# Patient Record
Sex: Female | Born: 1950 | Race: White | Hispanic: No | Smoking: Former smoker
Health system: Southern US, Community
[De-identification: ages and names within clinical notes are randomized; demographics above are authoritative.]

## PROBLEM LIST (undated history)

## (undated) DIAGNOSIS — H3320 Serous retinal detachment, unspecified eye: Secondary | ICD-10-CM

## (undated) DIAGNOSIS — M8580 Other specified disorders of bone density and structure, unspecified site: Secondary | ICD-10-CM

## (undated) DIAGNOSIS — M1990 Unspecified osteoarthritis, unspecified site: Secondary | ICD-10-CM

## (undated) DIAGNOSIS — H269 Unspecified cataract: Secondary | ICD-10-CM

## (undated) DIAGNOSIS — C801 Malignant (primary) neoplasm, unspecified: Secondary | ICD-10-CM

## (undated) HISTORY — PX: RETINAL DETACHMENT SURGERY: SHX105

## (undated) HISTORY — PX: OOPHORECTOMY: SHX86

## (undated) HISTORY — DX: Unspecified osteoarthritis, unspecified site: M19.90

## (undated) HISTORY — DX: Unspecified cataract: H26.9

## (undated) HISTORY — DX: Other specified disorders of bone density and structure, unspecified site: M85.80

## (undated) HISTORY — PX: EYE SURGERY: SHX253

## (undated) HISTORY — DX: Malignant (primary) neoplasm, unspecified: C80.1

## (undated) HISTORY — PX: COLONOSCOPY: SHX174

## (undated) HISTORY — DX: Serous retinal detachment, unspecified eye: H33.20

---
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
## 1998-02-27 ENCOUNTER — Other Ambulatory Visit: Admission: RE | Admit: 1998-02-27 | Discharge: 1998-02-27 | Payer: Self-pay | Admitting: Obstetrics and Gynecology

## 1998-07-25 ENCOUNTER — Other Ambulatory Visit: Admission: RE | Admit: 1998-07-25 | Discharge: 1998-07-25 | Payer: Self-pay | Admitting: Gynecology

## 1999-02-04 ENCOUNTER — Other Ambulatory Visit: Admission: RE | Admit: 1999-02-04 | Discharge: 1999-02-04 | Payer: Self-pay | Admitting: Gynecology

## 1999-02-04 ENCOUNTER — Encounter (INDEPENDENT_AMBULATORY_CARE_PROVIDER_SITE_OTHER): Payer: Self-pay | Admitting: Specialist

## 1999-07-09 ENCOUNTER — Encounter: Admission: RE | Admit: 1999-07-09 | Discharge: 1999-07-09 | Payer: Self-pay | Admitting: Gynecology

## 1999-07-09 ENCOUNTER — Encounter: Payer: Self-pay | Admitting: Gynecology

## 1999-07-12 ENCOUNTER — Encounter: Payer: Self-pay | Admitting: Gynecology

## 1999-07-12 ENCOUNTER — Encounter: Admission: RE | Admit: 1999-07-12 | Discharge: 1999-07-12 | Payer: Self-pay | Admitting: Gynecology

## 1999-08-14 HISTORY — PX: BREAST BIOPSY: SHX20

## 1999-08-26 ENCOUNTER — Encounter: Payer: Self-pay | Admitting: Surgery

## 1999-08-26 ENCOUNTER — Ambulatory Visit (HOSPITAL_BASED_OUTPATIENT_CLINIC_OR_DEPARTMENT_OTHER): Admission: RE | Admit: 1999-08-26 | Discharge: 1999-08-26 | Payer: Self-pay | Admitting: Surgery

## 1999-09-12 ENCOUNTER — Other Ambulatory Visit: Admission: RE | Admit: 1999-09-12 | Discharge: 1999-09-12 | Payer: Self-pay | Admitting: Gynecology

## 2000-01-03 ENCOUNTER — Encounter: Payer: Self-pay | Admitting: Surgery

## 2000-01-03 ENCOUNTER — Encounter: Admission: RE | Admit: 2000-01-03 | Discharge: 2000-01-03 | Payer: Self-pay | Admitting: Surgery

## 2000-07-14 ENCOUNTER — Encounter: Payer: Self-pay | Admitting: Surgery

## 2000-07-14 ENCOUNTER — Encounter: Admission: RE | Admit: 2000-07-14 | Discharge: 2000-07-14 | Payer: Self-pay | Admitting: Surgery

## 2000-07-17 ENCOUNTER — Encounter: Admission: RE | Admit: 2000-07-17 | Discharge: 2000-07-17 | Payer: Self-pay | Admitting: Surgery

## 2000-07-17 ENCOUNTER — Encounter: Payer: Self-pay | Admitting: Surgery

## 2000-09-23 ENCOUNTER — Encounter: Admission: RE | Admit: 2000-09-23 | Discharge: 2000-09-23 | Payer: Self-pay | Admitting: Surgery

## 2000-09-23 ENCOUNTER — Encounter (INDEPENDENT_AMBULATORY_CARE_PROVIDER_SITE_OTHER): Payer: Self-pay | Admitting: *Deleted

## 2000-09-23 ENCOUNTER — Encounter: Payer: Self-pay | Admitting: Surgery

## 2000-09-23 ENCOUNTER — Ambulatory Visit (HOSPITAL_BASED_OUTPATIENT_CLINIC_OR_DEPARTMENT_OTHER): Admission: RE | Admit: 2000-09-23 | Discharge: 2000-09-23 | Payer: Self-pay | Admitting: Orthopedic Surgery

## 2000-10-20 ENCOUNTER — Other Ambulatory Visit: Admission: RE | Admit: 2000-10-20 | Discharge: 2000-10-20 | Payer: Self-pay | Admitting: Gynecology

## 2001-01-13 HISTORY — PX: ABDOMINAL HYSTERECTOMY: SHX81

## 2001-02-17 ENCOUNTER — Encounter: Admission: RE | Admit: 2001-02-17 | Discharge: 2001-02-17 | Payer: Self-pay | Admitting: Surgery

## 2001-02-17 ENCOUNTER — Encounter: Payer: Self-pay | Admitting: Surgery

## 2001-04-15 ENCOUNTER — Other Ambulatory Visit: Admission: RE | Admit: 2001-04-15 | Discharge: 2001-04-15 | Payer: Self-pay | Admitting: Gynecology

## 2001-07-06 ENCOUNTER — Encounter: Payer: Self-pay | Admitting: Surgery

## 2001-07-06 ENCOUNTER — Encounter: Admission: RE | Admit: 2001-07-06 | Discharge: 2001-07-06 | Payer: Self-pay | Admitting: Surgery

## 2001-07-14 ENCOUNTER — Inpatient Hospital Stay (HOSPITAL_COMMUNITY): Admission: RE | Admit: 2001-07-14 | Discharge: 2001-07-16 | Payer: Self-pay | Admitting: Gynecology

## 2001-07-14 ENCOUNTER — Encounter (INDEPENDENT_AMBULATORY_CARE_PROVIDER_SITE_OTHER): Payer: Self-pay | Admitting: Specialist

## 2001-12-01 ENCOUNTER — Other Ambulatory Visit: Admission: RE | Admit: 2001-12-01 | Discharge: 2001-12-01 | Payer: Self-pay | Admitting: Gynecology

## 2002-07-14 ENCOUNTER — Encounter: Payer: Self-pay | Admitting: Surgery

## 2002-07-14 ENCOUNTER — Encounter: Admission: RE | Admit: 2002-07-14 | Discharge: 2002-07-14 | Payer: Self-pay | Admitting: Surgery

## 2002-12-15 ENCOUNTER — Other Ambulatory Visit: Admission: RE | Admit: 2002-12-15 | Discharge: 2002-12-15 | Payer: Self-pay | Admitting: Gynecology

## 2003-08-02 ENCOUNTER — Encounter: Admission: RE | Admit: 2003-08-02 | Discharge: 2003-08-02 | Payer: Self-pay | Admitting: Gynecology

## 2004-01-14 DIAGNOSIS — C50919 Malignant neoplasm of unspecified site of unspecified female breast: Secondary | ICD-10-CM

## 2004-01-14 DIAGNOSIS — C801 Malignant (primary) neoplasm, unspecified: Secondary | ICD-10-CM

## 2004-01-14 HISTORY — DX: Malignant neoplasm of unspecified site of unspecified female breast: C50.919

## 2004-01-14 HISTORY — PX: BREAST LUMPECTOMY: SHX2

## 2004-01-14 HISTORY — DX: Malignant (primary) neoplasm, unspecified: C80.1

## 2004-08-30 ENCOUNTER — Encounter: Admission: RE | Admit: 2004-08-30 | Discharge: 2004-08-30 | Payer: Self-pay | Admitting: Surgery

## 2004-09-11 ENCOUNTER — Encounter (INDEPENDENT_AMBULATORY_CARE_PROVIDER_SITE_OTHER): Payer: Self-pay | Admitting: *Deleted

## 2004-09-11 ENCOUNTER — Encounter (INDEPENDENT_AMBULATORY_CARE_PROVIDER_SITE_OTHER): Payer: Self-pay | Admitting: Diagnostic Radiology

## 2004-09-11 ENCOUNTER — Encounter: Admission: RE | Admit: 2004-09-11 | Discharge: 2004-09-11 | Payer: Self-pay | Admitting: Surgery

## 2004-09-19 ENCOUNTER — Encounter: Admission: RE | Admit: 2004-09-19 | Discharge: 2004-09-19 | Payer: Self-pay | Admitting: Surgery

## 2004-09-27 ENCOUNTER — Encounter: Admission: RE | Admit: 2004-09-27 | Discharge: 2004-09-27 | Payer: Self-pay | Admitting: Surgery

## 2004-09-27 ENCOUNTER — Ambulatory Visit (HOSPITAL_COMMUNITY): Admission: RE | Admit: 2004-09-27 | Discharge: 2004-09-27 | Payer: Self-pay | Admitting: Surgery

## 2004-09-27 ENCOUNTER — Ambulatory Visit (HOSPITAL_BASED_OUTPATIENT_CLINIC_OR_DEPARTMENT_OTHER): Admission: RE | Admit: 2004-09-27 | Discharge: 2004-09-27 | Payer: Self-pay | Admitting: Surgery

## 2004-09-27 ENCOUNTER — Encounter (INDEPENDENT_AMBULATORY_CARE_PROVIDER_SITE_OTHER): Payer: Self-pay | Admitting: Specialist

## 2004-10-07 ENCOUNTER — Ambulatory Visit: Payer: Self-pay | Admitting: Oncology

## 2004-10-16 ENCOUNTER — Ambulatory Visit (HOSPITAL_COMMUNITY): Admission: RE | Admit: 2004-10-16 | Discharge: 2004-10-16 | Payer: Self-pay | Admitting: Oncology

## 2004-10-25 ENCOUNTER — Other Ambulatory Visit: Admission: RE | Admit: 2004-10-25 | Discharge: 2004-10-25 | Payer: Self-pay | Admitting: Gynecology

## 2004-11-19 ENCOUNTER — Ambulatory Visit (HOSPITAL_COMMUNITY): Admission: RE | Admit: 2004-11-19 | Discharge: 2004-11-19 | Payer: Self-pay | Admitting: Oncology

## 2004-11-22 ENCOUNTER — Ambulatory Visit: Admission: RE | Admit: 2004-11-22 | Discharge: 2005-02-07 | Payer: Self-pay | Admitting: Radiation Oncology

## 2005-01-30 ENCOUNTER — Ambulatory Visit: Payer: Self-pay | Admitting: Oncology

## 2005-04-15 ENCOUNTER — Ambulatory Visit: Payer: Self-pay | Admitting: Oncology

## 2005-09-01 ENCOUNTER — Encounter: Admission: RE | Admit: 2005-09-01 | Discharge: 2005-09-01 | Payer: Self-pay | Admitting: Surgery

## 2005-11-10 ENCOUNTER — Ambulatory Visit: Payer: Self-pay | Admitting: Oncology

## 2006-03-02 ENCOUNTER — Other Ambulatory Visit: Admission: RE | Admit: 2006-03-02 | Discharge: 2006-03-02 | Payer: Self-pay | Admitting: Gynecology

## 2006-05-11 ENCOUNTER — Ambulatory Visit: Payer: Self-pay | Admitting: Oncology

## 2006-05-14 LAB — CBC WITH DIFFERENTIAL/PLATELET
BASO%: 0.3 % (ref 0.0–2.0)
Basophils Absolute: 0 10*3/uL (ref 0.0–0.1)
EOS%: 1.1 % (ref 0.0–7.0)
HCT: 40.1 % (ref 34.8–46.6)
HGB: 14.3 g/dL (ref 11.6–15.9)
LYMPH%: 21.6 % (ref 14.0–48.0)
MCH: 31.9 pg (ref 26.0–34.0)
MCHC: 35.5 g/dL (ref 32.0–36.0)
NEUT%: 65.2 % (ref 39.6–76.8)
Platelets: 258 10*3/uL (ref 145–400)
lymph#: 1.3 10*3/uL (ref 0.9–3.3)

## 2006-05-14 LAB — COMPREHENSIVE METABOLIC PANEL
ALT: 8 U/L (ref 0–35)
AST: 19 U/L (ref 0–37)
BUN: 22 mg/dL (ref 6–23)
CO2: 26 mEq/L (ref 19–32)
Calcium: 9.5 mg/dL (ref 8.4–10.5)
Chloride: 103 mEq/L (ref 96–112)
Creatinine, Ser: 1.11 mg/dL (ref 0.40–1.20)
Total Bilirubin: 0.7 mg/dL (ref 0.3–1.2)

## 2006-05-14 LAB — CANCER ANTIGEN 27.29: CA 27.29: 13 U/mL (ref 0–39)

## 2006-09-07 ENCOUNTER — Encounter: Admission: RE | Admit: 2006-09-07 | Discharge: 2006-09-07 | Payer: Self-pay | Admitting: Oncology

## 2006-11-09 ENCOUNTER — Ambulatory Visit: Payer: Self-pay | Admitting: Oncology

## 2006-11-11 LAB — CBC WITH DIFFERENTIAL/PLATELET
Eosinophils Absolute: 0.1 10*3/uL (ref 0.0–0.5)
HCT: 40.2 % (ref 34.8–46.6)
LYMPH%: 21.1 % (ref 14.0–48.0)
MONO#: 0.6 10*3/uL (ref 0.1–0.9)
NEUT#: 4.7 10*3/uL (ref 1.5–6.5)
NEUT%: 68.9 % (ref 39.6–76.8)
Platelets: 268 10*3/uL (ref 145–400)
RBC: 4.44 10*6/uL (ref 3.70–5.32)
WBC: 6.8 10*3/uL (ref 3.9–10.0)
lymph#: 1.4 10*3/uL (ref 0.9–3.3)

## 2006-11-11 LAB — COMPREHENSIVE METABOLIC PANEL
ALT: 8 U/L (ref 0–35)
Albumin: 4.5 g/dL (ref 3.5–5.2)
CO2: 25 mEq/L (ref 19–32)
Calcium: 9.6 mg/dL (ref 8.4–10.5)
Chloride: 102 mEq/L (ref 96–112)
Glucose, Bld: 77 mg/dL (ref 70–99)
Sodium: 139 mEq/L (ref 135–145)
Total Bilirubin: 0.6 mg/dL (ref 0.3–1.2)
Total Protein: 7.3 g/dL (ref 6.0–8.3)

## 2006-11-11 LAB — CANCER ANTIGEN 27.29: CA 27.29: 12 U/mL (ref 0–39)

## 2007-03-05 ENCOUNTER — Other Ambulatory Visit: Admission: RE | Admit: 2007-03-05 | Discharge: 2007-03-05 | Payer: Self-pay | Admitting: Gynecology

## 2007-08-14 ENCOUNTER — Ambulatory Visit: Payer: Self-pay | Admitting: Oncology

## 2007-08-17 LAB — CBC WITH DIFFERENTIAL/PLATELET
BASO%: 0.4 % (ref 0.0–2.0)
EOS%: 1 % (ref 0.0–7.0)
HGB: 14.1 g/dL (ref 11.6–15.9)
MCH: 31.9 pg (ref 26.0–34.0)
MCHC: 34.8 g/dL (ref 32.0–36.0)
MCV: 91.6 fL (ref 81.0–101.0)
MONO%: 11.1 % (ref 0.0–13.0)
RBC: 4.41 10*6/uL (ref 3.70–5.32)
RDW: 13.1 % (ref 11.3–14.5)
lymph#: 1.2 10*3/uL (ref 0.9–3.3)

## 2007-08-18 LAB — COMPREHENSIVE METABOLIC PANEL
ALT: 10 U/L (ref 0–35)
AST: 19 U/L (ref 0–37)
Albumin: 4.3 g/dL (ref 3.5–5.2)
Alkaline Phosphatase: 121 U/L — ABNORMAL HIGH (ref 39–117)
Potassium: 4.2 mEq/L (ref 3.5–5.3)
Sodium: 140 mEq/L (ref 135–145)
Total Bilirubin: 0.5 mg/dL (ref 0.3–1.2)
Total Protein: 6.9 g/dL (ref 6.0–8.3)

## 2007-09-08 ENCOUNTER — Encounter: Admission: RE | Admit: 2007-09-08 | Discharge: 2007-09-08 | Payer: Self-pay | Admitting: Oncology

## 2007-10-15 ENCOUNTER — Ambulatory Visit: Payer: Self-pay | Admitting: Oncology

## 2007-10-19 LAB — COMPREHENSIVE METABOLIC PANEL
ALT: 10 U/L (ref 0–35)
AST: 22 U/L (ref 0–37)
Albumin: 4.2 g/dL (ref 3.5–5.2)
Alkaline Phosphatase: 82 U/L (ref 39–117)
BUN: 13 mg/dL (ref 6–23)
Calcium: 9 mg/dL (ref 8.4–10.5)
Chloride: 100 mEq/L (ref 96–112)
Potassium: 4 mEq/L (ref 3.5–5.3)
Sodium: 135 mEq/L (ref 135–145)
Total Protein: 6.7 g/dL (ref 6.0–8.3)

## 2007-12-08 ENCOUNTER — Ambulatory Visit (HOSPITAL_COMMUNITY): Admission: RE | Admit: 2007-12-08 | Discharge: 2007-12-08 | Payer: Self-pay | Admitting: Ophthalmology

## 2007-12-24 ENCOUNTER — Ambulatory Visit (HOSPITAL_COMMUNITY): Admission: RE | Admit: 2007-12-24 | Discharge: 2007-12-24 | Payer: Self-pay | Admitting: Ophthalmology

## 2008-04-24 ENCOUNTER — Encounter: Payer: Self-pay | Admitting: Gynecology

## 2008-04-24 ENCOUNTER — Ambulatory Visit: Payer: Self-pay | Admitting: Gynecology

## 2008-04-24 ENCOUNTER — Other Ambulatory Visit: Admission: RE | Admit: 2008-04-24 | Discharge: 2008-04-24 | Payer: Self-pay | Admitting: Gynecology

## 2008-05-22 ENCOUNTER — Ambulatory Visit (HOSPITAL_COMMUNITY): Admission: RE | Admit: 2008-05-22 | Discharge: 2008-05-22 | Payer: Self-pay | Admitting: Ophthalmology

## 2008-09-08 ENCOUNTER — Encounter: Admission: RE | Admit: 2008-09-08 | Discharge: 2008-09-08 | Payer: Self-pay | Admitting: Surgery

## 2008-09-08 ENCOUNTER — Ambulatory Visit: Payer: Self-pay | Admitting: Oncology

## 2008-09-12 LAB — CBC WITH DIFFERENTIAL/PLATELET
Basophils Absolute: 0 10*3/uL (ref 0.0–0.1)
EOS%: 0.9 % (ref 0.0–7.0)
Eosinophils Absolute: 0 10*3/uL (ref 0.0–0.5)
HCT: 38.7 % (ref 34.8–46.6)
HGB: 13.5 g/dL (ref 11.6–15.9)
MCH: 32.5 pg (ref 25.1–34.0)
MCV: 93.2 fL (ref 79.5–101.0)
MONO%: 10 % (ref 0.0–14.0)
NEUT%: 62.5 % (ref 38.4–76.8)

## 2008-09-12 LAB — COMPREHENSIVE METABOLIC PANEL
AST: 21 U/L (ref 0–37)
Alkaline Phosphatase: 59 U/L (ref 39–117)
BUN: 14 mg/dL (ref 6–23)
Calcium: 9.1 mg/dL (ref 8.4–10.5)
Chloride: 102 mEq/L (ref 96–112)
Creatinine, Ser: 0.88 mg/dL (ref 0.40–1.20)
Glucose, Bld: 97 mg/dL (ref 70–99)

## 2009-03-05 ENCOUNTER — Ambulatory Visit: Payer: Self-pay | Admitting: Oncology

## 2009-03-07 LAB — RESEARCH LABS

## 2009-06-07 ENCOUNTER — Ambulatory Visit: Payer: Self-pay | Admitting: Gynecology

## 2009-06-07 ENCOUNTER — Other Ambulatory Visit: Admission: RE | Admit: 2009-06-07 | Discharge: 2009-06-07 | Payer: Self-pay | Admitting: Gynecology

## 2009-09-10 ENCOUNTER — Encounter: Admission: RE | Admit: 2009-09-10 | Discharge: 2009-09-10 | Payer: Self-pay | Admitting: Oncology

## 2009-09-13 ENCOUNTER — Ambulatory Visit: Payer: Self-pay | Admitting: Oncology

## 2009-09-18 LAB — CBC WITH DIFFERENTIAL/PLATELET
BASO%: 0.4 % (ref 0.0–2.0)
Basophils Absolute: 0 10*3/uL (ref 0.0–0.1)
EOS%: 1.4 % (ref 0.0–7.0)
Eosinophils Absolute: 0.1 10*3/uL (ref 0.0–0.5)
HCT: 39.3 % (ref 34.8–46.6)
HGB: 13.6 g/dL (ref 11.6–15.9)
LYMPH%: 22.8 % (ref 14.0–49.7)
MCH: 32.2 pg (ref 25.1–34.0)
MCHC: 34.5 g/dL (ref 31.5–36.0)
MCV: 93.1 fL (ref 79.5–101.0)
MONO#: 0.5 10*3/uL (ref 0.1–0.9)
MONO%: 8.5 % (ref 0.0–14.0)
NEUT#: 3.7 10*3/uL (ref 1.5–6.5)
NEUT%: 66.9 % (ref 38.4–76.8)
Platelets: 206 10*3/uL (ref 145–400)
RBC: 4.22 10*6/uL (ref 3.70–5.45)
RDW: 13.3 % (ref 11.2–14.5)
WBC: 5.6 10*3/uL (ref 3.9–10.3)
lymph#: 1.3 10*3/uL (ref 0.9–3.3)

## 2009-09-18 LAB — COMPREHENSIVE METABOLIC PANEL
ALT: <8 U/L (ref 0–35)
AST: 16 U/L (ref 0–37)
Albumin: 4 g/dL (ref 3.5–5.2)
Alkaline Phosphatase: 55 U/L (ref 39–117)
BUN: 19 mg/dL (ref 6–23)
CO2: 28 mEq/L (ref 19–32)
Calcium: 9.4 mg/dL (ref 8.4–10.5)
Chloride: 104 mEq/L (ref 96–112)
Creatinine, Ser: 0.91 mg/dL (ref 0.40–1.20)
Glucose, Bld: 101 mg/dL — ABNORMAL HIGH (ref 70–99)
Potassium: 4 mEq/L (ref 3.5–5.3)
Sodium: 139 mEq/L (ref 135–145)
Total Bilirubin: 0.4 mg/dL (ref 0.3–1.2)
Total Protein: 6.1 g/dL (ref 6.0–8.3)

## 2009-09-18 LAB — CANCER ANTIGEN 27.29: CA 27.29: 14 U/mL (ref 0–39)

## 2010-02-03 ENCOUNTER — Encounter: Payer: Self-pay | Admitting: Surgery

## 2010-03-08 ENCOUNTER — Encounter (HOSPITAL_BASED_OUTPATIENT_CLINIC_OR_DEPARTMENT_OTHER): Payer: BC Managed Care – PPO | Admitting: Oncology

## 2010-03-08 ENCOUNTER — Other Ambulatory Visit: Payer: Self-pay | Admitting: Oncology

## 2010-03-08 DIAGNOSIS — Z170 Estrogen receptor positive status [ER+]: Secondary | ICD-10-CM

## 2010-03-08 DIAGNOSIS — C50519 Malignant neoplasm of lower-outer quadrant of unspecified female breast: Secondary | ICD-10-CM

## 2010-03-08 LAB — CBC WITH DIFFERENTIAL/PLATELET
BASO%: 0.4 % (ref 0.0–2.0)
Basophils Absolute: 0 10*3/uL (ref 0.0–0.1)
EOS%: 1.5 % (ref 0.0–7.0)
Eosinophils Absolute: 0.1 10*3/uL (ref 0.0–0.5)
HCT: 39.2 % (ref 34.8–46.6)
HGB: 13.5 g/dL (ref 11.6–15.9)
LYMPH%: 25.6 % (ref 14.0–49.7)
MCH: 32.1 pg (ref 25.1–34.0)
MCHC: 34.3 g/dL (ref 31.5–36.0)
MCV: 93.5 fL (ref 79.5–101.0)
MONO#: 0.6 10*3/uL (ref 0.1–0.9)
MONO%: 13 % (ref 0.0–14.0)
NEUT#: 2.7 10*3/uL (ref 1.5–6.5)
Platelets: 204 10*3/uL (ref 145–400)
RBC: 4.19 10*6/uL (ref 3.70–5.45)
RDW: 13.3 % (ref 11.2–14.5)
WBC: 4.6 10*3/uL (ref 3.9–10.3)
lymph#: 1.2 10*3/uL (ref 0.9–3.3)

## 2010-03-08 LAB — COMPREHENSIVE METABOLIC PANEL
ALT: 10 U/L (ref 0–35)
AST: 23 U/L (ref 0–37)
Albumin: 3.8 g/dL (ref 3.5–5.2)
Alkaline Phosphatase: 49 U/L (ref 39–117)
CO2: 30 mEq/L (ref 19–32)
Calcium: 9 mg/dL (ref 8.4–10.5)
Chloride: 102 mEq/L (ref 96–112)
Glucose, Bld: 91 mg/dL (ref 70–99)
Potassium: 3.8 mEq/L (ref 3.5–5.3)
Sodium: 136 mEq/L (ref 135–145)
Total Bilirubin: 0.7 mg/dL (ref 0.3–1.2)
Total Protein: 6.5 g/dL (ref 6.0–8.3)

## 2010-03-12 ENCOUNTER — Encounter (HOSPITAL_BASED_OUTPATIENT_CLINIC_OR_DEPARTMENT_OTHER): Payer: BC Managed Care – PPO | Admitting: Oncology

## 2010-03-12 DIAGNOSIS — C50519 Malignant neoplasm of lower-outer quadrant of unspecified female breast: Secondary | ICD-10-CM

## 2010-03-12 DIAGNOSIS — Z170 Estrogen receptor positive status [ER+]: Secondary | ICD-10-CM

## 2010-04-23 LAB — BASIC METABOLIC PANEL
BUN: 12 mg/dL (ref 6–23)
CO2: 27 mEq/L (ref 19–32)
Calcium: 8.8 mg/dL (ref 8.4–10.5)
Chloride: 104 mEq/L (ref 96–112)
Creatinine, Ser: 0.85 mg/dL (ref 0.4–1.2)
GFR calc Af Amer: >60 mL/min (ref >60)
GFR calc non Af Amer: >60 mL/min (ref >60)
Glucose, Bld: 104 mg/dL — ABNORMAL HIGH (ref 70–99)
Potassium: 3.3 mEq/L — ABNORMAL LOW (ref 3.5–5.1)
Sodium: 136 mEq/L (ref 135–145)

## 2010-04-23 LAB — CBC
HCT: 42.7 % (ref 36.0–46.0)
Hemoglobin: 14.9 g/dL (ref 12.0–15.0)
MCHC: 34.8 g/dL (ref 30.0–36.0)
MCV: 92.6 fL (ref 78.0–100.0)
Platelets: 225 10*3/uL (ref 150–400)
RBC: 4.61 MIL/uL (ref 3.87–5.11)
RDW: 13 % (ref 11.5–15.5)
WBC: 5.5 10*3/uL (ref 4.0–10.5)

## 2010-05-28 NOTE — Op Note (Signed)
NAME:  Norma Mayer, Norma Mayer             ACCOUNT NO.:  [id number]   MEDICAL RECORD NO.:  [id number]          PATIENT TYPE:  AMB   LOCATION:  SDS                          FACILITY:  [facility]   PHYSICIAN:  Jillyn Hidden A. Rankin, M.D.   DATE OF BIRTH:  1950/05/16   DATE OF PROCEDURE:  12/08/2007  DATE OF DISCHARGE:                               OPERATIVE REPORT   PREOPERATIVE DIAGNOSES:  1. Choroidal detachment, serous, left eye, temporally and some      nasally.  2. History of retinal detachment repair, left eye, via scleral buckle      some week and a half before.   POSTOPERATIVE DIAGNOSES:  1. Choroidal detachment, serous, left eye, temporally and some      nasally.  2. History of retinal detachment repair, left eye, via scleral buckle      some week and a half before.   PROCEDURES:  1. Posterior vitrectomy with panphotocoagulation and focal laser      photocoagulation for repair of retinal detachment - 25 gauge      superiorly.  2. Injection of vitreous substitute - C3F8 8%.  3. External drainage of subretinal fluid, left eye.   SURGEON:  Alford Highland. Rankin, MD   ANESTHESIA:  General endotracheal anesthesia.   INDICATION FOR PROCEDURE:  The patient is a 60 year old woman who has  history of retinal detachment, successfully repaired with a scleral  buckle, but she did develop small amount of subretinal fluid superiorly,  and earlier this week required injection of vitreous substitute - C3F8,  into the vitreous cavity.  This successfully reattached the retina, but  she did develop serous choroidal detachments.  These serous choroidal  detachments, which had been small when present then became more  extensive, shallowing of the anterior chamber, and then subsequently now  occluding the visual axis.  The patient understands this is an attempts  to release the suprachoroidal fluid as well as to reattach the retina.  The patient understands the risks of anesthesia, including the rare  occurrence  of death, loss of the eye including but not limited to  hemorrhage, infection, scarring, need for further surgery, no change in  vision, loss of vision, progressive disease despite intervention, need  for another surgery, and cataract progression through the gaps.  The  patient understands these risks and wishes to proceed.  Appropriate  signed consent was obtained.  She was taken to the operating room.  In  the operating room, appropriate monitors followed by mild sedation.  General endotracheal anesthesia was induced without difficulty.  Left  periocular region was identified, time-out, and then subsequently,  sterilely prepped and draped in usual sterile fashion.  Lid speculum  applied.  Additional placement of conjunctival peritomy was then  fashioned in a radial fashion, inferotemporal quadrant, in the area of  the choroidal detachment.  A 25-gauge infusion was placed, and cannula  was then placed in the inferonasal quadrant, although this could be  initially identified in the vitreous cavity.  The infusion was then  turned on.  This allowed drainage of the mostly clear fluid and straw-  colored  fluid from the suprachoroidal space.  It was clear, however,  though that the infusion was going to be in the way of further  manipulation.  Indirect ophthalmoscopy confirmed that there was shallow  choroidal detachment still nasally.  Because of the low infusion rates,  the decision was made to place a 20-gauge cannula in the inferotemporal  quadrant now that this area was smaller.  A 20-gauge infusion cannula  was placed in inferotemporal quadrant and was secured in place with 5-0  Mersilene mattress sutures.  Placement in the vitreous cavity was  verified visually prior to turning it on.  The suprachoroidal congestion  superiorly did apparently limit placement of the superior 25-gauge  cannulas; however, these were confirmed to be present and in place.  Instruments were placed in the  vitreous cavity and vitrectomy was then  carried out.  Normal findings, although there was a mild dot of vitreous  hemorrhage, which did occur during the placement of the 20-gauge  infusion cannula, and this was now removed.  Notably, all serous  choroidal detachments could be now clearly evacuated via the cut-down  sclerotomy which had been fashioned in the inferotemporal quadrant  approximately 8 mm posterior to the limbus.  This cut-down sclerotomy  had been fashioned with a 0.01 Grieshaber blade.  All fluid was removed  in this fashion.  Vitrectomy was then completed, and a notable finding  was a small amount of subretinal fluid posterior to the scleral buckle  superiorly.  This was drained with a 25-gauge extrusion needle  superiorly.  Thick subretinal fluid was removed in this fashion.  This  allowed for retinal laser photocoagulation around this site of the  drainage site as well as in the bed of detachment in this region.  No  complications occurred.  Plan was to then place intraocular gas to  complete the intraocular tamponade.  Fluid-air exchange completed.  Air  - C3F8 8% exchange completed.  The superior trocars were removed.  The  infusion removed and closed with 7-0 Vicryl suture.  The conjunctiva  closed with 7-0 Vicryl suture.  Subconjunctival Decadron applied  inferiorly.  Sterile patch and Fox shield were applied.  Intraocular  pressure was assessed and found be adequate.  The patient was taken to  the recovery room in good and stable condition without complication.      Alford Highland Rankin, M.D.  Electronically Signed     GAR/MEDQ  D:  12/08/2007  T:  12/09/2007  Job:  [id number]

## 2010-05-28 NOTE — Op Note (Signed)
NAME:  Norma Mayer, Norma Mayer             ACCOUNT NO.:  [id number]   MEDICAL RECORD NO.:  [id number]          PATIENT TYPE:  AMB   LOCATION:  SDS                          FACILITY:  [facility]   PHYSICIAN:  Jillyn Hidden A. Rankin, M.D.   DATE OF BIRTH:  May 22, 1950   DATE OF PROCEDURE:  05/22/2008  DATE OF DISCHARGE:  05/22/2008                               OPERATIVE REPORT   PREOPERATIVE DIAGNOSES:  1. Tractional detachment, left eye.  2. Proliferative vitreoretinopathy, left eye - recurrent stage C1,      inferior peripheral retinal hole reopening with star fold adjacent      to it centered at the 5:30 position.  3. 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  4. Retained foreign body - nonmagnetic - silicone oil left eye.   PROCEDURES:  1. Posterior vitrectomy with membrane peel - 25 gauge with combined      with inferior retinectomy of limited less than 1 o' clock hour      extent.  2. Panretinal photocoagulation endolaser focally in the bed of the      detachment for retinopexy.  3. Injection of temporary vitreous substitute - Perfluoron - to      protect the macula during fluid-air exchange, the macula was      attached.  4. Gas-fluid exchange.  5. Infection of vitreous substitute - SC3F8 10%.  6. Radial surgical posterior capsulotomy with radial incisions with      intraocular scissors.   SURGEON:  Alford Highland. Rankin, M.D.   ANESTHESIA:  Local as per my anesthesia control.   INDICATIONS FOR PROCEDURE:  The patient is a 60 year old woman who has  had multiple acute opthalmic retinal procedures to reattach retina.  The  retina is mostly attached.  She has developed star fold at 5:30 position  with adjacent hole at 5 o'clock.  Peripheral retinal detachment all over  the anterior to the retinal vascular arcade, was slightly posterior to a  previously placed buccal.  This is an attempt to remove the silicone oil  and to reattach the retina.  The patient understands the risks of  anesthesia including recurrence, death, loss of the eye including but  not limited to hemorrhage, infection, scarring, need for surgery, no  change in vision, loss of vision, and progressive disease in spite of  intervention.  Proper signed consent was obtained.   DESCRIPTION OF PROCEDURE:  The patient was taken to the Operating Room.  In the Operating Room, appropriate monitors followed by mild sedation.  A 2% Xylocaine was injected with 5 mL of retrobulbar with additional 5  mL laterally in fashion of modified Darel Hong.  The left periocular  region was sterilely prepped and draped in the usual sterile fashion.  A  lid speculum was applied.  A 25-gauge trocar was placed in the  infratemporal quadrant transconjunctivally after slicing the conjunctiva  and the infusion was then turned on.  Superior trocars applied 25-gauge.  The small conjunctiva was previously fashioned also superonasally to  prepare for silicone oil traction.  Inspection of the retina with  intraocular instruments, was closed as there  is a significant star fold.  With initial bimanual dissection under silicone oil with 25-gauge  forceps was successful.  Mobilizing the star fold, there did appear to  be a significant intraretinal fibrous contracture and that this did not  appear as this was flattened.  For this reason, the silicone oil was  removed.  This was carried out with after a 20-gauge MVR placed through  the scar and a traction set was then used to evacuate the silicone oil  from the eye.  Under fluid dissection was continued.  Decision was made  to create a retinotomy to encompass the retinal hole along the adjacent  star folds so as to flatten the peripheral retina.  Excellent retinal  mobilization was obtained for this 1 o'clock hour excision.  At this  time, a decision was made to use Perfluoron to protect the macular  region during the fluid-air exchange.  Perfluoron was placed over the  macula and then  fluid-air exchange carried out over the top of this, so  this allowed further drainage peripherally through the retinal brake,  and the retina flattened nicely without difficulty and without  contracture.  Under the Perfluoron and under air endolaser  photocoagulation placed in the bed of attachment on the edges of the  brakes.  Hemostasis was spontaneous.  At this time, Perfluoron was  aspirated from the eye and the retina remained nicely attached.  At this  time, the large superior sclerotomy was closed with 7-0 Vicryl suture.  The remaining 25-gauge instruments were then used to complete the fluid-  air exchange.  The air-C3F8 10% exchange was then completed.  The  superior trocar was then removed and the infusion was removed.  Conjunctiva was closed with 7-0 Vicryl.  Subconjunctival Decadron was  applied.  Sterile patch and Fox shield applied.  It must be noted that  during the procedure that the phimotic capsule did in fact limit  visualization posteriorly and that a 20-gauge curve scissors were then  used to create radial incisions so as to allow the phimotic capsule to  open and to have a better visualization.   At this time, the instruments were removed from the eye.  The sterile  patch and Fox shield applied.  The patient tolerated the procedure well  without complication.      Alford Highland Rankin, M.D.  Electronically Signed     GAR/MEDQ  D:  05/22/2008  T:  05/23/2008  Job:  [id number]

## 2010-05-28 NOTE — Op Note (Signed)
NAME:  Norma Mayer, Norma Mayer             ACCOUNT NO.:  [id number]   MEDICAL RECORD NO.:  [id number]          PATIENT TYPE:  AMB   LOCATION:  SDS                          FACILITY:  [facility]   PHYSICIAN:  Jillyn Hidden A. Rankin, M.D.   DATE OF BIRTH:  [birth year]   DATE OF PROCEDURE:  12/24/2007  DATE OF DISCHARGE:                               OPERATIVE REPORT   PREOPERATIVE DIAGNOSES:  1. Tractional detachment, left eye.  2. Proliferative vitreoretinopathy, left eye - stage C2, inferior,      macular threatened.  3. Posterior subcapsular cataract, left eye.   POSTOPERATIVE DIAGNOSES:  1. Tractional detachment, left eye.  2. Proliferative vitreoretinopathy, left eye - stage C2, inferior,      macular threatened.  3. Posterior subcapsular cataract, left eye.  4. History of repair of superior retinal detachment via scleral buckle      some 3-1/2 weeks previously.  5. Status post vitrectomy for recurrent superior retinal detachment      some 2 weeks previous, superiorly in this left eye and now with a      new inferior retinal detachment.   SURGEON:  Alford Highland. Rankin, MD   ANESTHESIA:  General endotracheal anesthesia as per patient's request.  She was quite nervous.   PROCEDURES:  1. Posterior vitrectomy - membrane peel - combined 25 and 20 gauge      vitrectomy with membrane peel of the left eye with a superonasal      sclerotomy being 20 gauge for manipulation of the retina as well as      for subsequent placement of silicone oil.  2. Pars plana lensectomy, left eye done to allow for adequate      dissection of the anterior vitreous base inferiorly as well as      visualization through the cataract was nearly impossible      inferiorly.  3. Injection of vitreous substitute - permanent silicone oil.  4. Endolaser panretinal photocoagulation, left eye to repair retinal      detachment in conjunction with all the elements above.   INDICATIONS FOR PROCEDURE:  The patient is a 60 year old woman  who has  developed inferior retinal proliferative vitreoretinopathy - stage C2 on  the basis of having had retinal detachment, subsequent repair.  Now, she  has developed proliferative vitreoretinopathy cataract which is limiting  her visualization posteriorly as well as well necessitate egress of  vitrectomy, vitreous base dissection in this left eye.  The patient  understands the risks of anesthesia including recurrence, death, loss of  the eye, particularly from the underlying disease including but not  limited to hemorrhage, infection, scarring, need for surgery, no change  in vision, loss of vision, and progressive disease in spite  intervention.  Proper signed consent was obtained.   DESCRIPTION OF PROCEDURE:  The patient was taken to the operating room.  In the operating room, appropriate monitors followed by mild sedation.  General endotracheal anesthesia was instituted without difficulty.  Left  periocular region was sterilely prepped and draped in the usual  ophthalmic fashion after identifying the operative site.  The lid  speculum  was applied.  A 25-gauge trocar was placed in the infratemporal  quadrant with infusion and this was turned on.  The remaining gas and  the vitreous cavity spontaneously egressed.  Superotemporal sclerotomy  was placed and subsequently superonasally.  Initial vitrectomy was  started.  Once the previous vitrectomy had been done, so attention was  then made to use scleral depression inferiorly to dissect out the  vitreous base.  There was quite clear, though that the significant  vitreous base contraction had occurred and manipulation of the vitreous  base using scleral depression was in fact ongoing to create more  cataract progression.  The current cataract also hamper visualization  inferiorly.  For this reason, it was necessary to perform pars plana  lensectomy.  Posterior capsule was entered.  Initially vitrectomy  instruments and then  subsequently fragments of instruments were then  used to remove the lens.  This was removed without difficulty.  The  anterior capsule was initially preserved to reduce flow of the anterior  chamber in the cornea.  Thereafter, a scleral depression could be used  to complete the vitreous base dissection in a bimanual fashion.  Excellent mobilization of the vitreous base was performed.  Two star  folds were freed and now I had excellent mobility.  Notably, the  vitreous base contraction had occurred over the previously placed  scleral band inferiorly.   At this time, decision was made to make a posterior retinotomy.  Peripheral retinal holes were present, but this necessitated the use of  Perfluoron in the risk of retention.  Fluid-air exchange carried out  through the posterior retinotomy.  Reaccumulating fluid preretinal and  subretinal was aspirated.  The retina had flattened nicely without  difficulty and without excessive tenting.  At this time, endolaser  photocoagulation placed on the slope of buckle, bed of attachment, over  and around the star folds, and around the holes and the star folds.  This occurred at the equator and slightly anterior.   At this time, the reaccumulating fluid was removed using 20 gauge brush  technique through the sclerotomies.  The retinotomy was sealed with an  endolaser photocoagulation.  No complications occurred.  Decision was  made at this time to make an inferior peripheral iridectomy.  Prior to  the fluid-air exchange, initial fluid-air exchange was also noted that  the anterior capsule was opened from the central 6.5-7 mm for  postoperative visualization but also to prevent sequestration of any  migratory silicone into the anterior chamber.   At this time, the inferior peripheral iridectomy was fashioned using  vitrectomy instrumentation.  This will allow for postoperative  positioning to prevent pupillary block.  At this time, all fluid that   was visible had been removed and re-aspirated.  At this time, the  superior sclerotomies were prepared for closure.  Silicone oil was  injected passively and appropriate intraocular pressure was found to be  present.  The sclerotomies were then closed with 7-0 Vicryl sutures.  Conjunctiva was then closed with 7-0 Vicryl suture.  Copious irrigation  had been carried out previously.  Subconjunctival Decadron was applied.  Intraocular pressure was assessed and found to be appropriate and  adequate.  Sterile patch and fox-shield applied.  The patient tolerated  the procedure well without complication.  The patient was taken to the  PACU in good and stable condition after awakening without difficulty.      Alford Highland Rankin, M.D.  Electronically Signed     GAR/MEDQ  D:  12/24/2007  T:  12/25/2007  Job:  [id number]

## 2010-05-31 NOTE — Discharge Summary (Signed)
[facility]  Patient:    Norma Mayer, Norma Mayer Visit Number: [id number] MRN: [id number]          Service Type: GYN Location: 4W 0466 01 Attending Physician:  Merrily Pew Dictated by:   Nadyne Coombes Fontaine, M.D. Admit Date:  07/14/2001 Discharge Date: 07/16/2001                             Discharge Summary  DISCHARGE DIAGNOSES:          1. Benign mucinous cystadenoma, right ovary.                               2. Leiomyomata.                               3. Endosalpingiosis.  PROCEDURE:                    Total abdominal hysterectomy with bilateral                               salpingo-oophorectomy on July 14, 2001.  HOSPITAL COURSE:              The patient was admitted and underwent a TAH and BSO on July 14, 2001, with the findings as noted above.  The patients postoperative course was uncomplicated and she was discharged on postoperative day number two, and well-tolerating a regular diet with a postoperative hematocrit of 36.  DISPOSITION:                  The patient received instructions, precautions, and followup.  She will be seen on the Monday following discharge, for the removal of her staples.  The patient received Tylox #20, one to two p.o. q.4-6h. p.r.n. pain. Dictated by:   Nadyne Coombes. Fontaine, M.D. Attending Physician:  Merrily Pew DD:  07/16/01 TD:  07/20/01 Job: [id number]

## 2010-05-31 NOTE — H&P (Signed)
[facility]  Patient:    Norma Mayer, Norma Mayer Visit Number: [id number] MRN: [id number]          Service Type: GYN Location: 4W 0466 01 Attending Physician:  Merrily Pew Dictated by:   Nadyne Coombes Fontaine, M.D. Admit Date:  07/14/2001                           History and Physical  PREOPERATIVE DIAGNOSES: 1. Leiomyomata. 2. Ovarian cystic changes.  HISTORY OF PRESENT ILLNESS:  A 60 year old G0, perimenopausal, with history of persistent ovarian cystic changes, leiomyomata, and pelvic aching.  The patient has been followed expectantly with various options reviewed, to include intervention of surgical versus continued expectant management. Options for laparoscopy, oophorectomy, up to including hysterectomy with bilateral salpingo-oophorectomy were all reviewed with her, and she elects for total abdominal hysterectomy and bilateral salpingo-oophorectomy.  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  The patient is extremely anxious about her ovarian cystic changes and desires to proceed with surgery.  PAST MEDICAL HISTORY:  None.  PAST SURGICAL HISTORY:  Breast biopsy x2.  ALLERGIES:  BELLADONNA and NIZORAL.  REVIEW OF SYSTEMS:  Noncontributory.  SOCIAL HISTORY:  Noncontributory.  FAMILY HISTORY:  Uterine cancer in the mother.  PHYSICAL EXAMINATION:  HEENT:  Normal.  CHEST:  Lungs clear.  CARDIAC:  Regular rate without rubs, murmurs, or gallops.  ABDOMEN:  Benign.  PELVIC:  External, BUS, vagina normal.  Cervix normal.  Uterus grossly normal size, well-supported.  Adnexa without masses or tenderness.  ASSESSMENT:  A 60 year old G0, persistent ovarian cystic changes, negative CA125, leiomyomata, history of atypical ductal hyperplasia on breast biopsy, family history of uterine cancer, who desires total abdominal hysterectomy with bilateral  salpingo-oophorectomy.  I reviewed with the patient the expected intraoperative and postoperative courses and the recovery period.  I reviewed the risks of both acute intraoperative, postoperative, as well as long-term.  I reviewed the risks of infection, both internal requiring parenteral antibiotics as well as abscess formation requiring opening and draining of abscesses.  I reviewed wound complications including infectious as well as seroma, hematoma, and the need for opening of draining of incisions and closure by secondary intention.  The risks of hemorrhage necessitating transfusion and the risks of transfusion to include transfusion reaction, hepatitis, HIV, mad cow disease, and other unknown entities, were all reviewed with her.  The risks of inadvertent injury to internal organs, including bowel, bladder, ureters, vessels, and nerves, necessitating major exploratory reparative surgery and future reparative surgeries, including ostomy formation, were discussed with her.  The absolute sterility of hysterectomy was reviewed.  The issues of sexuality following hysterectomy to include persistent dyspareunia as well as orgasmic dysfunction was all discussed, understood, and accepted.  The patient understands there are no guarantees that she would not develop an ovarian-like cancer in the future and that removal of her ovaries does not guarantee that she will not develop a cancer. She also understands that we are not going to sample her uterus as there is no reason to suspect that there is a uterine abnormality such as a cancerous change and that upon final pathology there may be issues at that time.  I discussed with her the availability of a gynecologic oncologist on standby and that he may be needed during the case and that we would proceed if there is a question or overt cancer  found with a cancer staging procedure to include total abdominal hysterectomy, bilateral  salpingo-oophorectomy, lymph node dissection with periaortic and pelvic as well as omentectomy, biopsies, and possible appendectomy as well as resection of cancer even if it involves secondary organs.  I discussed the risks of major cancer surgery, to include lymph node dissections, to include the risk of vascular injury, neurologic injury, and lymphedema risks.  The patients questions are answered to her satisfaction, and she is ready to proceed with surgery. Dictated by:   Nadyne Coombes. Fontaine, M.D. Attending Physician:  Merrily Pew DD:  07/13/01 TD:  07/14/01 Job: [id number]

## 2010-06-14 ENCOUNTER — Encounter (INDEPENDENT_AMBULATORY_CARE_PROVIDER_SITE_OTHER): Payer: Self-pay | Admitting: Surgery

## 2010-07-03 ENCOUNTER — Other Ambulatory Visit: Payer: Self-pay | Admitting: Gynecology

## 2010-07-03 ENCOUNTER — Other Ambulatory Visit (HOSPITAL_COMMUNITY)
Admission: RE | Admit: 2010-07-03 | Discharge: 2010-07-03 | Disposition: A | Discharge status: Home / Self Care | Payer: BC Managed Care – PPO | Source: Ambulatory Visit | Attending: Gynecology | Admitting: Gynecology

## 2010-07-03 ENCOUNTER — Encounter (INDEPENDENT_AMBULATORY_CARE_PROVIDER_SITE_OTHER): Payer: BC Managed Care – PPO | Admitting: Gynecology

## 2010-07-03 DIAGNOSIS — R82998 Other abnormal findings in urine: Secondary | ICD-10-CM

## 2010-07-03 DIAGNOSIS — Z124 Encounter for screening for malignant neoplasm of cervix: Secondary | ICD-10-CM | POA: Insufficient documentation

## 2010-07-03 DIAGNOSIS — B3731 Acute candidiasis of vulva and vagina: Secondary | ICD-10-CM

## 2010-07-03 DIAGNOSIS — Z01419 Encounter for gynecological examination (general) (routine) without abnormal findings: Secondary | ICD-10-CM

## 2010-07-03 DIAGNOSIS — B373 Candidiasis of vulva and vagina: Secondary | ICD-10-CM

## 2010-07-09 ENCOUNTER — Encounter (INDEPENDENT_AMBULATORY_CARE_PROVIDER_SITE_OTHER): Payer: BC Managed Care – PPO

## 2010-07-09 DIAGNOSIS — M949 Disorder of cartilage, unspecified: Secondary | ICD-10-CM

## 2010-07-09 DIAGNOSIS — M8580 Other specified disorders of bone density and structure, unspecified site: Secondary | ICD-10-CM | POA: Insufficient documentation

## 2010-08-13 ENCOUNTER — Other Ambulatory Visit: Payer: Self-pay | Admitting: Oncology

## 2010-08-13 ENCOUNTER — Other Ambulatory Visit (INDEPENDENT_AMBULATORY_CARE_PROVIDER_SITE_OTHER): Payer: Self-pay | Admitting: Surgery

## 2010-08-13 DIAGNOSIS — Z9889 Other specified postprocedural states: Secondary | ICD-10-CM

## 2010-08-13 DIAGNOSIS — Z853 Personal history of malignant neoplasm of breast: Secondary | ICD-10-CM

## 2010-09-12 ENCOUNTER — Ambulatory Visit
Admission: RE | Admit: 2010-09-12 | Discharge: 2010-09-12 | Disposition: A | Discharge status: Home / Self Care | Payer: BC Managed Care – PPO | Source: Ambulatory Visit | Attending: Surgery | Admitting: Surgery

## 2010-09-12 ENCOUNTER — Other Ambulatory Visit (INDEPENDENT_AMBULATORY_CARE_PROVIDER_SITE_OTHER): Payer: Self-pay | Admitting: Surgery

## 2010-09-12 DIAGNOSIS — Z9889 Other specified postprocedural states: Secondary | ICD-10-CM

## 2010-09-12 DIAGNOSIS — Z853 Personal history of malignant neoplasm of breast: Secondary | ICD-10-CM

## 2010-09-24 ENCOUNTER — Telehealth (INDEPENDENT_AMBULATORY_CARE_PROVIDER_SITE_OTHER): Payer: Self-pay | Admitting: Surgery

## 2010-09-24 NOTE — Telephone Encounter (Signed)
Pt called about a 2nd opinion on her latest mgm films from the [facility], would like to know if she needs to make an appt to speak with Dr. Daphine Deutscher?

## 2010-09-24 NOTE — Telephone Encounter (Signed)
Also would like opinion if she should follow up with an [place] in 3 months instead of 6 months for a mammogram. Pt is very concerned.

## 2010-09-25 ENCOUNTER — Telehealth (INDEPENDENT_AMBULATORY_CARE_PROVIDER_SITE_OTHER): Payer: Self-pay | Admitting: Surgery

## 2010-10-15 LAB — BASIC METABOLIC PANEL
BUN: 12
Creatinine, Ser: 0.85
GFR calc Af Amer: >60
GFR calc non Af Amer: >60
Potassium: 4.2

## 2010-10-15 LAB — CBC
Platelets: 241
RBC: 4.78
WBC: 7.4

## 2010-10-17 LAB — BASIC METABOLIC PANEL
BUN: 14 mg/dL (ref 6–23)
CO2: 25 mEq/L (ref 19–32)
Chloride: 103 mEq/L (ref 96–112)
Glucose, Bld: 103 mg/dL — ABNORMAL HIGH (ref 70–99)
Potassium: 4.1 mEq/L (ref 3.5–5.1)

## 2010-10-17 LAB — CBC
HCT: 43 % (ref 36.0–46.0)
Hemoglobin: 14.6 g/dL (ref 12.0–15.0)
MCV: 93.7 fL (ref 78.0–100.0)
WBC: 6.1 10*3/uL (ref 4.0–10.5)

## 2010-10-30 ENCOUNTER — Encounter (INDEPENDENT_AMBULATORY_CARE_PROVIDER_SITE_OTHER): Payer: Self-pay | Admitting: Surgery

## 2011-02-24 ENCOUNTER — Other Ambulatory Visit (INDEPENDENT_AMBULATORY_CARE_PROVIDER_SITE_OTHER): Payer: Self-pay | Admitting: Surgery

## 2011-02-24 DIAGNOSIS — N6489 Other specified disorders of breast: Secondary | ICD-10-CM

## 2011-03-12 ENCOUNTER — Ambulatory Visit
Admission: RE | Admit: 2011-03-12 | Discharge: 2011-03-12 | Disposition: A | Discharge status: Home / Self Care | Payer: BC Managed Care – PPO | Source: Ambulatory Visit | Attending: Surgery | Admitting: Surgery

## 2011-03-12 DIAGNOSIS — N6489 Other specified disorders of breast: Secondary | ICD-10-CM

## 2011-05-08 ENCOUNTER — Emergency Department (HOSPITAL_COMMUNITY)
Admission: EM | Admit: 2011-05-08 | Discharge: 2011-05-08 | Disposition: A | Discharge status: Home / Self Care | Payer: No Typology Code available for payment source | Attending: Emergency Medicine | Admitting: Emergency Medicine

## 2011-05-08 ENCOUNTER — Encounter (HOSPITAL_COMMUNITY): Payer: Self-pay | Admitting: Emergency Medicine

## 2011-05-08 DIAGNOSIS — Y998 Other external cause status: Secondary | ICD-10-CM | POA: Insufficient documentation

## 2011-05-08 DIAGNOSIS — S139XXA Sprain of joints and ligaments of unspecified parts of neck, initial encounter: Secondary | ICD-10-CM | POA: Insufficient documentation

## 2011-05-08 DIAGNOSIS — Y93I9 Activity, other involving external motion: Secondary | ICD-10-CM | POA: Insufficient documentation

## 2011-05-08 DIAGNOSIS — S161XXA Strain of muscle, fascia and tendon at neck level, initial encounter: Secondary | ICD-10-CM

## 2011-05-08 MED ORDER — IBUPROFEN 600 MG PO TABS: 600.0000 mg | ORAL_TABLET | Freq: Four times a day (QID) | ORAL | Status: AC | PRN
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
## 2011-05-08 NOTE — Discharge Instructions (Signed)
Return to the ED with any concerns including numbness or weakness of your arms, worsening pain, chest or abdominal pain, or any other alarming symptoms.
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
## 2011-08-25 ENCOUNTER — Other Ambulatory Visit: Payer: Self-pay | Admitting: Internal Medicine

## 2011-08-25 ENCOUNTER — Other Ambulatory Visit (INDEPENDENT_AMBULATORY_CARE_PROVIDER_SITE_OTHER): Payer: Self-pay | Admitting: Surgery

## 2011-08-25 DIAGNOSIS — Z853 Personal history of malignant neoplasm of breast: Secondary | ICD-10-CM

## 2011-08-25 DIAGNOSIS — Z9889 Other specified postprocedural states: Secondary | ICD-10-CM

## 2011-09-16 ENCOUNTER — Ambulatory Visit
Admission: RE | Admit: 2011-09-16 | Discharge: 2011-09-16 | Disposition: A | Discharge status: Home / Self Care | Payer: BC Managed Care – PPO | Source: Ambulatory Visit | Attending: Internal Medicine | Admitting: Internal Medicine

## 2011-09-16 DIAGNOSIS — Z853 Personal history of malignant neoplasm of breast: Secondary | ICD-10-CM

## 2011-09-16 DIAGNOSIS — Z9889 Other specified postprocedural states: Secondary | ICD-10-CM

## 2011-09-23 ENCOUNTER — Ambulatory Visit (INDEPENDENT_AMBULATORY_CARE_PROVIDER_SITE_OTHER): Payer: BC Managed Care – PPO | Admitting: Gynecology

## 2011-09-23 ENCOUNTER — Encounter: Payer: Self-pay | Admitting: Gynecology

## 2011-09-23 VITALS — BP 132/78 | Ht 63.0 in | Wt 158.0 lb

## 2011-09-23 DIAGNOSIS — M8580 Other specified disorders of bone density and structure, unspecified site: Secondary | ICD-10-CM

## 2011-09-23 DIAGNOSIS — M949 Disorder of cartilage, unspecified: Secondary | ICD-10-CM

## 2011-09-23 DIAGNOSIS — Z01419 Encounter for gynecological examination (general) (routine) without abnormal findings: Secondary | ICD-10-CM

## 2011-09-23 DIAGNOSIS — C50919 Malignant neoplasm of unspecified site of unspecified female breast: Secondary | ICD-10-CM

## 2011-09-23 DIAGNOSIS — N952 Postmenopausal atrophic vaginitis: Secondary | ICD-10-CM

## 2011-09-23 NOTE — Patient Instructions (Signed)
Follow up in one year, sooner if any issues 

## 2011-09-23 NOTE — Progress Notes (Signed)
Norma Mayer August 31, 1950 [id number]        61 y.o.  G0P0 for annual exam.  Doing well without complaints.  Past medical history,surgical history, medications, allergies, family history and social history were all reviewed and documented in the EPIC chart. ROS:  Was performed and pertinent positives and negatives are included in the history.  Exam: Sherrilyn Rist assistant Filed Vitals:   09/23/11 1019  BP: 132/78  Height: 5\' 3"  (1.6 m)  Weight: 158 lb (71.668 kg)   General appearance  Normal Skin grossly normal Head/Neck normal with no cervical or supraclavicular adenopathy thyroid normal Lungs  clear Cardiac RR, without RMG Abdominal  soft, nontender, without masses, organomegaly or hernia Breasts  examined lying and sitting. Left without masses, retractions, discharge or axillary adenopathy.  Right with lumpectomy scar retraction. No acute changes, masses, retractions, discharge, adenopathy. Pelvic  Ext/BUS/vagina  normal with atrophic changes   Adnexa  Without masses or tenderness    Anus and perineum  normal   Rectovaginal  normal sphincter tone without palpated masses or tenderness.    Assessment/Plan:  61 y.o. G0P0 female for annual exam.   1. 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 Doing well without significant menopausal symptoms. We'll continue to monitor. 2. Atrophic vaginitis. Asymptomatic. We'll continue to monitor. 3. History of breast cancer, infiltrating ductal, ER/PR positive. Doing well with NED.  We'll continue to follow for her mammography annually. SBE reviewed. 4. Osteopenia. DEXA 06/2010 T score -2.3. FRAX 11%/0.3%. Increase calcium vitamin D reviewed we'll plan repeat next year. 5. Colonoscopy. Patient's up-to-date with colonoscopy and were repeated there recommended screening interval. 6. Health maintenance. No lab work done today is all done through her primary physician's office who she sees on a regular basis. Blood pressure  mildly elevated. Patient notes she hasn't done daily when she takes her mother's blood pressure is always normal. We'll continue to monitor at home. Follow up one year, sooner as needed.    Dara Lords MD, 10:55 AM 09/23/2011

## 2011-09-24 ENCOUNTER — Encounter: Payer: BC Managed Care – PPO | Admitting: Gynecology

## 2011-09-30 ENCOUNTER — Encounter: Payer: Self-pay | Admitting: Gynecology

## 2012-08-31 ENCOUNTER — Other Ambulatory Visit: Payer: Self-pay

## 2012-08-31 DIAGNOSIS — Z1231 Encounter for screening mammogram for malignant neoplasm of breast: Secondary | ICD-10-CM

## 2012-09-20 ENCOUNTER — Ambulatory Visit
Admission: RE | Admit: 2012-09-20 | Discharge: 2012-09-20 | Disposition: A | Discharge status: Home / Self Care | Payer: BC Managed Care – PPO | Source: Ambulatory Visit

## 2012-09-20 DIAGNOSIS — Z1231 Encounter for screening mammogram for malignant neoplasm of breast: Secondary | ICD-10-CM

## 2012-10-06 ENCOUNTER — Ambulatory Visit (INDEPENDENT_AMBULATORY_CARE_PROVIDER_SITE_OTHER): Payer: BC Managed Care – PPO | Admitting: Gynecology

## 2012-10-06 ENCOUNTER — Encounter: Payer: Self-pay | Admitting: Gynecology

## 2012-10-06 VITALS — BP 124/76 | Ht 63.0 in | Wt 159.0 lb

## 2012-10-06 DIAGNOSIS — M899 Disorder of bone, unspecified: Secondary | ICD-10-CM

## 2012-10-06 DIAGNOSIS — C801 Malignant (primary) neoplasm, unspecified: Secondary | ICD-10-CM | POA: Insufficient documentation

## 2012-10-06 DIAGNOSIS — M8580 Other specified disorders of bone density and structure, unspecified site: Secondary | ICD-10-CM

## 2012-10-06 DIAGNOSIS — Z01419 Encounter for gynecological examination (general) (routine) without abnormal findings: Secondary | ICD-10-CM

## 2012-10-06 DIAGNOSIS — C50911 Malignant neoplasm of unspecified site of right female breast: Secondary | ICD-10-CM | POA: Insufficient documentation

## 2012-10-06 DIAGNOSIS — N952 Postmenopausal atrophic vaginitis: Secondary | ICD-10-CM

## 2012-10-06 DIAGNOSIS — C50919 Malignant neoplasm of unspecified site of unspecified female breast: Secondary | ICD-10-CM

## 2012-10-06 NOTE — Progress Notes (Signed)
Norma Mayer 12/24/1950 [id number]        62 y.o.  G0P0 for annual exam.  Several issues noted below.  Past medical history,surgical history, medications, allergies, family history and social history were all reviewed and documented in the EPIC chart.  ROS:  Performed and pertinent positives and negatives are included in the history, assessment and plan .  Exam: Norma Mayer assistant Filed Vitals:   10/06/12 1050  BP: 124/76  Height: 5\' 3"  (1.6 m)  Weight: 159 lb (72.122 kg)   General appearance  Normal Skin grossly normal Head/Neck normal with no cervical or supraclavicular adenopathy thyroid normal Lungs  clear Cardiac RR, without RMG Abdominal  soft, nontender, without masses, organomegaly or hernia Breasts  examined lying and sitting. Left without masses, retractions, discharge or axillary adenopathy. Right with lumpectomy scar retraction. No acute masses retractions discharge adenopathy Pelvic  Ext/BUS/vagina  normal with atrophic changes  Adnexa  Without masses or tenderness    Anus and perineum  normal   Rectovaginal  normal sphincter tone without palpated masses or tenderness.    Assessment/Plan:  62 y.o. G0P0 female for annual exam.   1. 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 Doing well without significant menopausal symptoms. We'll continue to monitor. 2. Atrophic vaginitis. Asymptomatic. We'll continue to monitor. 3. Pap smear 2012. No Pap smear done today. Status post hysterectomy for benign indications. Options to stop screening altogether or less frequent screening intervals reviewed. We'll readdress on an annual basis. 4. History of breast cancer, infiltrating ductal, ER/PR positive. Doing well with NED.  Mammography 09/2012. Will continue with annual mammography. SBE reviewed. 5. Osteopenia. DEXA 06/2010 T score -2.3. FRAX 11%/0.3%. Plan repeat now and patient will schedule. Increase calcium vitamin D reviewed. 6. Colonoscopy. 03/2012.  Repeat at their recommended interval 7. Health maintenance. No lab work done today as it is all done through her primary physician's office who she sees on a regular basis. Follow up one year, sooner as needed.   Note: This document was prepared with digital dictation and possible smart phrase technology. Any transcriptional errors that result from this process are unintentional.   Dara Lords MD, 11:13 AM 10/06/2012

## 2012-10-06 NOTE — Patient Instructions (Addendum)
Follow up for your bone density

## 2012-10-07 LAB — URINALYSIS W MICROSCOPIC + REFLEX CULTURE
Bacteria, UA: NONE SEEN
Bilirubin Urine: NEGATIVE
Casts: NONE SEEN
Glucose, UA: NEGATIVE mg/dL
Hgb urine dipstick: NEGATIVE
Ketones, ur: NEGATIVE mg/dL
Leukocytes, UA: NEGATIVE
pH: 6 (ref 5.0–8.0)

## 2012-12-07 ENCOUNTER — Ambulatory Visit (INDEPENDENT_AMBULATORY_CARE_PROVIDER_SITE_OTHER): Payer: BC Managed Care – PPO

## 2012-12-07 DIAGNOSIS — M899 Disorder of bone, unspecified: Secondary | ICD-10-CM

## 2012-12-07 DIAGNOSIS — M8580 Other specified disorders of bone density and structure, unspecified site: Secondary | ICD-10-CM

## 2012-12-13 ENCOUNTER — Encounter: Payer: Self-pay | Admitting: Gynecology

## 2012-12-17 ENCOUNTER — Telehealth: Payer: Self-pay | Admitting: *Deleted

## 2012-12-17 NOTE — Telephone Encounter (Signed)
Pt had questions regarding bone density all questions answered.

## 2013-08-23 ENCOUNTER — Other Ambulatory Visit: Payer: Self-pay

## 2013-08-23 DIAGNOSIS — Z1231 Encounter for screening mammogram for malignant neoplasm of breast: Secondary | ICD-10-CM

## 2013-08-23 DIAGNOSIS — Z853 Personal history of malignant neoplasm of breast: Secondary | ICD-10-CM

## 2013-08-23 DIAGNOSIS — Z9889 Other specified postprocedural states: Secondary | ICD-10-CM

## 2013-09-21 ENCOUNTER — Ambulatory Visit
Admission: RE | Admit: 2013-09-21 | Discharge: 2013-09-21 | Disposition: A | Discharge status: Home / Self Care | Payer: BC Managed Care – PPO | Source: Ambulatory Visit

## 2013-09-21 DIAGNOSIS — Z9889 Other specified postprocedural states: Secondary | ICD-10-CM

## 2013-09-21 DIAGNOSIS — Z853 Personal history of malignant neoplasm of breast: Secondary | ICD-10-CM

## 2013-09-21 DIAGNOSIS — Z1231 Encounter for screening mammogram for malignant neoplasm of breast: Secondary | ICD-10-CM

## 2013-10-18 ENCOUNTER — Encounter: Payer: BC Managed Care – PPO | Admitting: Gynecology

## 2013-11-25 ENCOUNTER — Ambulatory Visit (INDEPENDENT_AMBULATORY_CARE_PROVIDER_SITE_OTHER): Payer: BC Managed Care – PPO | Admitting: Gynecology

## 2013-11-25 ENCOUNTER — Other Ambulatory Visit (HOSPITAL_COMMUNITY)
Admission: RE | Admit: 2013-11-25 | Discharge: 2013-11-25 | Disposition: A | Discharge status: Home / Self Care | Payer: BC Managed Care – PPO | Source: Ambulatory Visit | Attending: Gynecology | Admitting: Gynecology

## 2013-11-25 ENCOUNTER — Encounter: Payer: Self-pay | Admitting: Gynecology

## 2013-11-25 VITALS — BP 120/76 | Ht 62.0 in | Wt 162.0 lb

## 2013-11-25 DIAGNOSIS — Z01419 Encounter for gynecological examination (general) (routine) without abnormal findings: Secondary | ICD-10-CM

## 2013-11-25 DIAGNOSIS — Z23 Encounter for immunization: Secondary | ICD-10-CM

## 2013-11-25 DIAGNOSIS — M8580 Other specified disorders of bone density and structure, unspecified site: Secondary | ICD-10-CM

## 2013-11-25 DIAGNOSIS — N952 Postmenopausal atrophic vaginitis: Secondary | ICD-10-CM

## 2013-11-25 DIAGNOSIS — N9089 Other specified noninflammatory disorders of vulva and perineum: Secondary | ICD-10-CM

## 2013-11-25 MED ORDER — NYSTATIN-TRIAMCINOLONE 100000-0.1 UNIT/GM-% EX OINT: 1.0000 "application " | TOPICAL_OINTMENT | Freq: Two times a day (BID) | CUTANEOUS | Status: DC

## 2013-11-25 NOTE — Patient Instructions (Signed)
Start the nystatin/triamcinolone cream twice daily as needed for irritation. Apply externally only. Follow up if symptoms are not relieved with this.  You may obtain a copy of any labs that were done today by logging onto MyChart as outlined in the instructions provided with your AVS (after visit summary). The office will not call with normal lab results but certainly if there are any significant abnormalities then we will contact you.   Health Maintenance, Female A healthy lifestyle and preventative care can promote health and wellness.  Maintain regular health, dental, and eye exams.  Eat a healthy diet. Foods like vegetables, fruits, whole grains, low-fat dairy products, and lean protein foods contain the nutrients you need without too many calories. Decrease your intake of foods high in solid fats, added sugars, and salt. Get information about a proper diet from your caregiver, if necessary.  Regular physical exercise is one of the most important things you can do for your health. Most adults should get at least 150 minutes of moderate-intensity exercise (any activity that increases your heart rate and causes you to sweat) each week. In addition, most adults need muscle-strengthening exercises on 2 or more days a week.   Maintain a healthy weight. The body mass index (BMI) is a screening tool to identify possible weight problems. It provides an estimate of body fat based on height and weight. Your caregiver can help determine your BMI, and can help you achieve or maintain a healthy weight. For adults 20 years and older:  A BMI below 18.5 is considered underweight.  A BMI of 18.5 to 24.9 is normal.  A BMI of 25 to 29.9 is considered overweight.  A BMI of 30 and above is considered obese.  Maintain normal blood lipids and cholesterol by exercising and minimizing your intake of saturated fat. Eat a balanced diet with plenty of fruits and vegetables. Blood tests for lipids and cholesterol  should begin at age 11 and be repeated every 5 years. If your lipid or cholesterol levels are high, you are over 50, or you are a high risk for heart disease, you may need your cholesterol levels checked more frequently.Ongoing high lipid and cholesterol levels should be treated with medicines if diet and exercise are not effective.  If you smoke, find out from your caregiver how to quit. If you do not use tobacco, do not start.  Lung cancer screening is recommended for adults aged 64 80 years who are at high risk for developing lung cancer because of a history of smoking. Yearly low-dose computed tomography (CT) is recommended for people who have at least a 30-pack-year history of smoking and are a current smoker or have quit within the past 15 years. A pack year of smoking is smoking an average of 1 pack of cigarettes a day for 1 year (for example: 1 pack a day for 30 years or 2 packs a day for 15 years). Yearly screening should continue until the smoker has stopped smoking for at least 15 years. Yearly screening should also be stopped for people who develop a health problem that would prevent them from having lung cancer treatment.  If you are pregnant, do not drink alcohol. If you are breastfeeding, be very cautious about drinking alcohol. If you are not pregnant and choose to drink alcohol, do not exceed 1 drink per day. One drink is considered to be 12 ounces (355 mL) of beer, 5 ounces (148 mL) of wine, or 1.5 ounces (44 mL) of liquor.  Avoid use of street drugs. Do not share needles with anyone. Ask for help if you need support or instructions about stopping the use of drugs.  High blood pressure causes heart disease and increases the risk of stroke. Blood pressure should be checked at least every 1 to 2 years. Ongoing high blood pressure should be treated with medicines, if weight loss and exercise are not effective.  If you are 40 to 64 years old, ask your caregiver if you should take aspirin  to prevent strokes.  Diabetes screening involves taking a blood sample to check your fasting blood sugar level. This should be done once every 3 years, after age 80, if you are within normal weight and without risk factors for diabetes. Testing should be considered at a younger age or be carried out more frequently if you are overweight and have at least 1 risk factor for diabetes.  Breast cancer screening is essential preventative care for women. You should practice "breast self-awareness." This means understanding the normal appearance and feel of your breasts and may include breast self-examination. Any changes detected, no matter how small, should be reported to a caregiver. Women in their 23s and 30s should have a clinical breast exam (CBE) by a caregiver as part of a regular health exam every 1 to 3 years. After age 98, women should have a CBE every year. Starting at age 65, women should consider having a mammogram (breast X-ray) every year. Women who have a family history of breast cancer should talk to their caregiver about genetic screening. Women at a high risk of breast cancer should talk to their caregiver about having an MRI and a mammogram every year.  Breast cancer gene (BRCA)-related cancer risk assessment is recommended for women who have family members with BRCA-related cancers. BRCA-related cancers include breast, ovarian, tubal, and peritoneal cancers. Having family members with these cancers may be associated with an increased risk for harmful changes (mutations) in the breast cancer genes BRCA1 and BRCA2. Results of the assessment will determine the need for genetic counseling and BRCA1 and BRCA2 testing.  The Pap test is a screening test for cervical cancer. Women should have a Pap test starting at age 67. Between ages 58 and 95, Pap tests should be repeated every 2 years. Beginning at age 56, you should have a Pap test every 3 years as long as the past 3 Pap tests have been normal. If  you had a hysterectomy for a problem that was not cancer or a condition that could lead to cancer, then you no longer need Pap tests. If you are between ages 86 and 108, and you have had normal Pap tests going back 10 years, you no longer need Pap tests. If you have had past treatment for cervical cancer or a condition that could lead to cancer, you need Pap tests and screening for cancer for at least 20 years after your treatment. If Pap tests have been discontinued, risk factors (such as a new sexual partner) need to be reassessed to determine if screening should be resumed. Some women have medical problems that increase the chance of getting cervical cancer. In these cases, your caregiver may recommend more frequent screening and Pap tests.  The human papillomavirus (HPV) test is an additional test that may be used for cervical cancer screening. The HPV test looks for the virus that can cause the cell changes on the cervix. The cells collected during the Pap test can be tested for HPV. The  HPV test could be used to screen women aged 77 years and older, and should be used in women of any age who have unclear Pap test results. After the age of 77, women should have HPV testing at the same frequency as a Pap test.  Colorectal cancer can be detected and often prevented. Most routine colorectal cancer screening begins at the age of 17 and continues through age 80. However, your caregiver may recommend screening at an earlier age if you have risk factors for colon cancer. On a yearly basis, your caregiver may provide home test kits to check for hidden blood in the stool. Use of a small camera at the end of a tube, to directly examine the colon (sigmoidoscopy or colonoscopy), can detect the earliest forms of colorectal cancer. Talk to your caregiver about this at age 32, when routine screening begins. Direct examination of the colon should be repeated every 5 to 10 years through age 43, unless early forms of  pre-cancerous polyps or small growths are found.  Hepatitis C blood testing is recommended for all people born from [birth year] through 1966 and any individual with known risks for hepatitis C.  Practice safe sex. Use condoms and avoid high-risk sexual practices to reduce the spread of sexually transmitted infections (STIs). Sexually active women aged 45 and younger should be checked for Chlamydia, which is a common sexually transmitted infection. Older women with new or multiple partners should also be tested for Chlamydia. Testing for other STIs is recommended if you are sexually active and at increased risk.  Osteoporosis is a disease in which the bones lose minerals and strength with aging. This can result in serious bone fractures. The risk of osteoporosis can be identified using a bone density scan. Women ages 13 and over and women at risk for fractures or osteoporosis should discuss screening with their caregivers. Ask your caregiver whether you should be taking a calcium supplement or vitamin D to reduce the rate of osteoporosis.  Menopause can be associated with physical symptoms and risks. Hormone replacement therapy is available to decrease symptoms and risks. You should talk to your caregiver about whether hormone replacement therapy is right for you.  Use sunscreen. Apply sunscreen liberally and repeatedly throughout the day. You should seek shade when your shadow is shorter than you. Protect yourself by wearing long sleeves, pants, a wide-brimmed hat, and sunglasses year round, whenever you are outdoors.  Notify your caregiver of new moles or changes in moles, especially if there is a change in shape or color. Also notify your caregiver if a mole is larger than the size of a pencil eraser.  Stay current with your immunizations. Document Released: 07/15/2010 Document Revised: 04/26/2012 Document Reviewed: 07/15/2010 [facility] Patient Information 2014 [place].

## 2013-11-25 NOTE — Addendum Note (Signed)
Addended by: Nelva Nay on: 11/25/2013 12:37 PM   Modules accepted: Orders, SmartSet

## 2013-11-25 NOTE — Progress Notes (Signed)
Norma Mayer October 02, 1950 [id number]        63 y.o.  G0P0 for annual exam.  Several issues noted below.  Past medical history,surgical history, problem list, medications, allergies, family history and social history were all reviewed and documented as reviewed in the EPIC chart.  ROS:  12 system ROS performed with pertinent positives and negatives included in the history, assessment and plan.   Additional significant findings :  none   Exam: Norma Mayer Vitals:   11/25/13 1111  BP: 120/76  Height: 5\' 2"  (1.575 m)  Weight: 162 lb (73.483 kg)   General appearance:  Normal affect, orientation and appearance. Skin: Grossly normal HEENT: Without gross lesions.  No cervical or supraclavicular adenopathy. Thyroid normal.  Lungs:  Clear without wheezing, rales or rhonchi Cardiac: RR, without RMG Abdominal:  Soft, nontender, without masses, guarding, rebound, organomegaly or hernia Breasts:  Examined lying and sitting. Left without masses, retractions, discharge or axillary adenopathy.  Right with lumpectomy scar retraction,, old changes. No masses, nipple discharge or axillary adenopathy Pelvic:  Ext/BUS/vagina with atrophic changes. Pap done  Adnexa  Without masses or tenderness    Anus and perineum  Normal   Rectovaginal  Normal sphincter tone without palpated masses or tenderness.    Assessment/Plan:  63 y.o. G0P0 female for annual exam.   1. Postmenopausal/atrophic genital changes. Status post TAH/BSO 2003 for leiomyoma/endosalpingosis/mucinous cystadenoma.Patient without significant symptoms of hot flashes, night sweats, vaginal dryness. Is not sexually active. Will continue to monitor. 2. Vaginal irritation for the last 2 weeks. Exam grossly is normal. Will try Mytrex equivalent externally twice a day as needed. Assuming this resolves her symptoms and will follow. If persists she'll represent for exam. 3. Osteopenia. DEXA 2014 T score -2.4 FRAX 7.9%/0.6%.  Stable from  prior DEXA 2012. Plan repeat DEXA next year at two-year interval. Increase calcium and vitamin D recommendations reviewed. 4. Pap smear 2012. Pap smear of vaginal cuff today. No history of significant abnormal Pap smears previously. Status post hysterectomy for benign indications. Options to stop screening reviewed and we will readdress on an annual basis. 5. History of breast cancer, infiltrating ductal, ER/PR positive. Exam NED. Mammography 09/2013. Continue with annual mammography. SBE monthly reviewed. 6. Colonoscopy 2014. Repeat at their recommended interval. 7. Health maintenance. No routine blood work done as she reports this done through her primary physician's office.  Follow up 1 year, sooner as needed.     Anastasio Auerbach MD, 11:48 AM 11/25/2013

## 2013-11-26 LAB — URINALYSIS W MICROSCOPIC + REFLEX CULTURE
BACTERIA UA: NONE SEEN
CASTS: NONE SEEN
CRYSTALS: NONE SEEN
Glucose, UA: NEGATIVE mg/dL
Hgb urine dipstick: NEGATIVE
KETONES UR: NEGATIVE mg/dL
Leukocytes, UA: NEGATIVE
NITRITE: NEGATIVE
PH: 5 (ref 5.0–8.0)
Protein, ur: NEGATIVE mg/dL
SPECIFIC GRAVITY, URINE: 1.023 (ref 1.005–1.030)
UROBILINOGEN UA: 0.2 mg/dL (ref 0.0–1.0)

## 2013-11-29 LAB — CYTOLOGY - PAP

## 2014-08-17 ENCOUNTER — Other Ambulatory Visit: Payer: Self-pay | Admitting: Internal Medicine

## 2014-08-17 DIAGNOSIS — N644 Mastodynia: Secondary | ICD-10-CM

## 2014-08-17 DIAGNOSIS — Z9889 Other specified postprocedural states: Secondary | ICD-10-CM

## 2014-08-17 DIAGNOSIS — Z853 Personal history of malignant neoplasm of breast: Secondary | ICD-10-CM

## 2014-08-22 ENCOUNTER — Other Ambulatory Visit: Payer: Self-pay | Admitting: Internal Medicine

## 2014-08-22 ENCOUNTER — Ambulatory Visit
Admission: RE | Admit: 2014-08-22 | Discharge: 2014-08-22 | Disposition: A | Discharge status: Home / Self Care | Payer: BLUE CROSS/BLUE SHIELD | Source: Ambulatory Visit | Attending: Internal Medicine | Admitting: Internal Medicine

## 2014-08-22 DIAGNOSIS — Z853 Personal history of malignant neoplasm of breast: Secondary | ICD-10-CM

## 2014-08-22 DIAGNOSIS — Z9889 Other specified postprocedural states: Secondary | ICD-10-CM

## 2014-08-22 DIAGNOSIS — N644 Mastodynia: Secondary | ICD-10-CM

## 2014-08-30 ENCOUNTER — Other Ambulatory Visit: Payer: Self-pay

## 2014-08-30 DIAGNOSIS — Z1231 Encounter for screening mammogram for malignant neoplasm of breast: Secondary | ICD-10-CM

## 2014-10-06 ENCOUNTER — Ambulatory Visit
Admission: RE | Admit: 2014-10-06 | Discharge: 2014-10-06 | Disposition: A | Discharge status: Home / Self Care | Payer: BLUE CROSS/BLUE SHIELD | Source: Ambulatory Visit

## 2014-10-06 DIAGNOSIS — Z1231 Encounter for screening mammogram for malignant neoplasm of breast: Secondary | ICD-10-CM

## 2014-11-23 DIAGNOSIS — C44622 Squamous cell carcinoma of skin of right upper limb, including shoulder: Secondary | ICD-10-CM | POA: Diagnosis not present

## 2014-11-23 DIAGNOSIS — C44529 Squamous cell carcinoma of skin of other part of trunk: Secondary | ICD-10-CM | POA: Diagnosis not present

## 2014-11-23 DIAGNOSIS — L2089 Other atopic dermatitis: Secondary | ICD-10-CM | POA: Diagnosis not present

## 2014-11-23 DIAGNOSIS — L603 Nail dystrophy: Secondary | ICD-10-CM | POA: Diagnosis not present

## 2014-11-23 DIAGNOSIS — D485 Neoplasm of uncertain behavior of skin: Secondary | ICD-10-CM | POA: Diagnosis not present

## 2014-12-05 DIAGNOSIS — S93602A Unspecified sprain of left foot, initial encounter: Secondary | ICD-10-CM | POA: Diagnosis not present

## 2014-12-05 DIAGNOSIS — S99922A Unspecified injury of left foot, initial encounter: Secondary | ICD-10-CM | POA: Diagnosis not present

## 2014-12-28 DIAGNOSIS — L570 Actinic keratosis: Secondary | ICD-10-CM | POA: Diagnosis not present

## 2014-12-28 DIAGNOSIS — Z85828 Personal history of other malignant neoplasm of skin: Secondary | ICD-10-CM | POA: Diagnosis not present

## 2014-12-28 DIAGNOSIS — L603 Nail dystrophy: Secondary | ICD-10-CM | POA: Diagnosis not present

## 2014-12-28 DIAGNOSIS — L821 Other seborrheic keratosis: Secondary | ICD-10-CM | POA: Diagnosis not present

## 2015-02-20 DIAGNOSIS — Z85828 Personal history of other malignant neoplasm of skin: Secondary | ICD-10-CM | POA: Diagnosis not present

## 2015-02-20 DIAGNOSIS — L718 Other rosacea: Secondary | ICD-10-CM | POA: Diagnosis not present

## 2015-02-20 DIAGNOSIS — L918 Other hypertrophic disorders of the skin: Secondary | ICD-10-CM | POA: Diagnosis not present

## 2015-02-20 DIAGNOSIS — L821 Other seborrheic keratosis: Secondary | ICD-10-CM | POA: Diagnosis not present

## 2015-02-20 DIAGNOSIS — L738 Other specified follicular disorders: Secondary | ICD-10-CM | POA: Diagnosis not present

## 2015-03-09 ENCOUNTER — Telehealth: Payer: Self-pay | Admitting: *Deleted

## 2015-03-09 ENCOUNTER — Ambulatory Visit (INDEPENDENT_AMBULATORY_CARE_PROVIDER_SITE_OTHER): Payer: Medicare Other | Admitting: Gynecology

## 2015-03-09 ENCOUNTER — Encounter: Payer: Self-pay | Admitting: Gynecology

## 2015-03-09 VITALS — BP 124/80 | Ht 63.0 in | Wt 166.0 lb

## 2015-03-09 DIAGNOSIS — R8279 Other abnormal findings on microbiological examination of urine: Secondary | ICD-10-CM | POA: Diagnosis not present

## 2015-03-09 DIAGNOSIS — C50911 Malignant neoplasm of unspecified site of right female breast: Secondary | ICD-10-CM

## 2015-03-09 DIAGNOSIS — L292 Pruritus vulvae: Secondary | ICD-10-CM

## 2015-03-09 DIAGNOSIS — M8580 Other specified disorders of bone density and structure, unspecified site: Secondary | ICD-10-CM

## 2015-03-09 DIAGNOSIS — Z01419 Encounter for gynecological examination (general) (routine) without abnormal findings: Secondary | ICD-10-CM

## 2015-03-09 DIAGNOSIS — N952 Postmenopausal atrophic vaginitis: Secondary | ICD-10-CM

## 2015-03-09 MED ORDER — NYSTATIN-TRIAMCINOLONE 100000-0.1 UNIT/GM-% EX OINT: 1.0000 "application " | TOPICAL_OINTMENT | Freq: Two times a day (BID) | CUTANEOUS | Status: DC

## 2015-03-09 MED ORDER — NYSTATIN 100000 UNIT/GM EX OINT: 1.0000 "application " | TOPICAL_OINTMENT | Freq: Two times a day (BID) | CUTANEOUS | Status: DC

## 2015-03-09 MED ORDER — TRIAMCINOLONE ACETONIDE 0.5 % EX OINT: 1.0000 "application " | TOPICAL_OINTMENT | Freq: Two times a day (BID) | CUTANEOUS | Status: DC

## 2015-03-09 NOTE — Telephone Encounter (Signed)
Rx for nystatin-triamcinolone is not cover under patient plan, however if Rx sent separate it is cover. Rx will be sent

## 2015-03-09 NOTE — Patient Instructions (Signed)
Follow up for bone density as scheduled  You may obtain a copy of any labs that were done today by logging onto MyChart as outlined in the instructions provided with your AVS (after visit summary). The office will not call with normal lab results but certainly if there are any significant abnormalities then we will contact you.   Health Maintenance Adopting a healthy lifestyle and getting preventive care can go a long way to promote health and wellness. Talk with your health care provider about what schedule of regular examinations is right for you. This is a good chance for you to check in with your provider about disease prevention and staying healthy. In between checkups, there are plenty of things you can do on your own. Experts have done a lot of research about which lifestyle changes and preventive measures are most likely to keep you healthy. Ask your health care provider for more information. WEIGHT AND DIET  Eat a healthy diet  Be sure to include plenty of vegetables, fruits, low-fat dairy products, and lean protein.  Do not eat a lot of foods high in solid fats, added sugars, or salt.  Get regular exercise. This is one of the most important things you can do for your health.  Most adults should exercise for at least 150 minutes each week. The exercise should increase your heart rate and make you sweat (moderate-intensity exercise).  Most adults should also do strengthening exercises at least twice a week. This is in addition to the moderate-intensity exercise.  Maintain a healthy weight  Body mass index (BMI) is a measurement that can be used to identify possible weight problems. It estimates body fat based on height and weight. Your health care provider can help determine your BMI and help you achieve or maintain a healthy weight.  For females 17 years of age and older:   A BMI below 18.5 is considered underweight.  A BMI of 18.5 to 24.9 is normal.  A BMI of 25 to 29.9 is  considered overweight.  A BMI of 30 and above is considered obese.  Watch levels of cholesterol and blood lipids  You should start having your blood tested for lipids and cholesterol at 66 years of age, then have this test every 5 years.  You may need to have your cholesterol levels checked more often if:  Your lipid or cholesterol levels are high.  You are older than 66 years of age.  You are at high risk for heart disease.  CANCER SCREENING   Lung Cancer  Lung cancer screening is recommended for adults 74-19 years old who are at high risk for lung cancer because of a history of smoking.  A yearly low-dose CT scan of the lungs is recommended for people who:  Currently smoke.  Have quit within the past 15 years.  Have at least a 30-pack-year history of smoking. A pack year is smoking an average of one pack of cigarettes a day for 1 year.  Yearly screening should continue until it has been 15 years since you quit.  Yearly screening should stop if you develop a health problem that would prevent you from having lung cancer treatment.  Breast Cancer  Practice breast self-awareness. This means understanding how your breasts normally appear and feel.  It also means doing regular breast self-exams. Let your health care provider know about any changes, no matter how small.  If you are in your 20s or 30s, you should have a clinical breast exam (  CBE) by a health care provider every 1-3 years as part of a regular health exam.  If you are 56 or older, have a CBE every year. Also consider having a breast X-ray (mammogram) every year.  If you have a family history of breast cancer, talk to your health care provider about genetic screening.  If you are at high risk for breast cancer, talk to your health care provider about having an MRI and a mammogram every year.  Breast cancer gene (BRCA) assessment is recommended for women who have family members with BRCA-related cancers.  BRCA-related cancers include:  Breast.  Ovarian.  Tubal.  Peritoneal cancers.  Results of the assessment will determine the need for genetic counseling and BRCA1 and BRCA2 testing. Cervical Cancer Routine pelvic examinations to screen for cervical cancer are no longer recommended for nonpregnant women who are considered low risk for cancer of the pelvic organs (ovaries, uterus, and vagina) and who do not have symptoms. A pelvic examination may be necessary if you have symptoms including those associated with pelvic infections. Ask your health care provider if a screening pelvic exam is right for you.   The Pap test is the screening test for cervical cancer for women who are considered at risk.  If you had a hysterectomy for a problem that was not cancer or a condition that could lead to cancer, then you no longer need Pap tests.  If you are older than 65 years, and you have had normal Pap tests for the past 10 years, you no longer need to have Pap tests.  If you have had past treatment for cervical cancer or a condition that could lead to cancer, you need Pap tests and screening for cancer for at least 20 years after your treatment.  If you no longer get a Pap test, assess your risk factors if they change (such as having a new sexual partner). This can affect whether you should start being screened again.  Some women have medical problems that increase their chance of getting cervical cancer. If this is the case for you, your health care provider may recommend more frequent screening and Pap tests.  The human papillomavirus (HPV) test is another test that may be used for cervical cancer screening. The HPV test looks for the virus that can cause cell changes in the cervix. The cells collected during the Pap test can be tested for HPV.  The HPV test can be used to screen women 39 years of age and older. Getting tested for HPV can extend the interval between normal Pap tests from three to  five years.  An HPV test also should be used to screen women of any age who have unclear Pap test results.  After 65 years of age, women should have HPV testing as often as Pap tests.  Colorectal Cancer  This type of cancer can be detected and often prevented.  Routine colorectal cancer screening usually begins at 66 years of age and continues through 66 years of age.  Your health care provider may recommend screening at an earlier age if you have risk factors for colon cancer.  Your health care provider may also recommend using home test kits to check for hidden blood in the stool.  A small camera at the end of a tube can be used to examine your colon directly (sigmoidoscopy or colonoscopy). This is done to check for the earliest forms of colorectal cancer.  Routine screening usually begins at age 77.  Direct examination of the colon should be repeated every 5-10 years through 66 years of age. However, you may need to be screened more often if early forms of precancerous polyps or small growths are found. Skin Cancer  Check your skin from head to toe regularly.  Tell your health care provider about any new moles or changes in moles, especially if there is a change in a mole's shape or color.  Also tell your health care provider if you have a mole that is larger than the size of a pencil eraser.  Always use sunscreen. Apply sunscreen liberally and repeatedly throughout the day.  Protect yourself by wearing long sleeves, pants, a wide-brimmed hat, and sunglasses whenever you are outside. HEART DISEASE, DIABETES, AND HIGH BLOOD PRESSURE   Have your blood pressure checked at least every 1-2 years. High blood pressure causes heart disease and increases the risk of stroke.  If you are between 76 years and 55 years old, ask your health care provider if you should take aspirin to prevent strokes.  Have regular diabetes screenings. This involves taking a blood sample to check your  fasting blood sugar level.  If you are at a normal weight and have a low risk for diabetes, have this test once every three years after 66 years of age.  If you are overweight and have a high risk for diabetes, consider being tested at a younger age or more often. PREVENTING INFECTION  Hepatitis B  If you have a higher risk for hepatitis B, you should be screened for this virus. You are considered at high risk for hepatitis B if:  You were born in a country where hepatitis B is common. Ask your health care provider which countries are considered high risk.  Your parents were born in a high-risk country, and you have not been immunized against hepatitis B (hepatitis B vaccine).  You have HIV or AIDS.  You use needles to inject street drugs.  You live with someone who has hepatitis B.  You have had sex with someone who has hepatitis B.  You get hemodialysis treatment.  You take certain medicines for conditions, including cancer, organ transplantation, and autoimmune conditions. Hepatitis C  Blood testing is recommended for:  Everyone born from [birth year] through 1966.  Anyone with known risk factors for hepatitis C. Sexually transmitted infections (STIs)  You should be screened for sexually transmitted infections (STIs) including gonorrhea and chlamydia if:  You are sexually active and are younger than 66 years of age.  You are older than 66 years of age and your health care provider tells you that you are at risk for this type of infection.  Your sexual activity has changed since you were last screened and you are at an increased risk for chlamydia or gonorrhea. Ask your health care provider if you are at risk.  If you do not have HIV, but are at risk, it may be recommended that you take a prescription medicine daily to prevent HIV infection. This is called pre-exposure prophylaxis (PrEP). You are considered at risk if:  You are sexually active and do not regularly use condoms or  know the HIV status of your partner(s).  You take drugs by injection.  You are sexually active with a partner who has HIV. Talk with your health care provider about whether you are at high risk of being infected with HIV. If you choose to begin PrEP, you should first be tested for HIV. You should then be  tested every 3 months for as long as you are taking PrEP.  PREGNANCY   If you are premenopausal and you may become pregnant, ask your health care provider about preconception counseling.  If you may become pregnant, take 400 to 800 micrograms (mcg) of folic acid every day.  If you want to prevent pregnancy, talk to your health care provider about birth control (contraception). OSTEOPOROSIS AND MENOPAUSE   Osteoporosis is a disease in which the bones lose minerals and strength with aging. This can result in serious bone fractures. Your risk for osteoporosis can be identified using a bone density scan.  If you are 25 years of age or older, or if you are at risk for osteoporosis and fractures, ask your health care provider if you should be screened.  Ask your health care provider whether you should take a calcium or vitamin D supplement to lower your risk for osteoporosis.  Menopause may have certain physical symptoms and risks.  Hormone replacement therapy may reduce some of these symptoms and risks. Talk to your health care provider about whether hormone replacement therapy is right for you.  HOME CARE INSTRUCTIONS   Schedule regular health, dental, and eye exams.  Stay current with your immunizations.   Do not use any tobacco products including cigarettes, chewing tobacco, or electronic cigarettes.  If you are pregnant, do not drink alcohol.  If you are breastfeeding, limit how much and how often you drink alcohol.  Limit alcohol intake to no more than 1 drink per day for nonpregnant women. One drink equals 12 ounces of beer, 5 ounces of wine, or 1 ounces of hard liquor.  Do  not use street drugs.  Do not share needles.  Ask your health care provider for help if you need support or information about quitting drugs.  Tell your health care provider if you often feel depressed.  Tell your health care provider if you have ever been abused or do not feel safe at home. Document Released: 07/15/2010 Document Revised: 05/16/2013 Document Reviewed: 12/01/2012 [facility] Patient Information 2015 [place], [place]. This information is not intended to replace advice given to you by your health care provider. Make sure you discuss any questions you have with your health care provider.

## 2015-03-09 NOTE — Progress Notes (Signed)
Norma Mayer 1951/01/09 [id number]        64 y.o.  G0P0  for breast and pelvic exam.  Past medical history,surgical history, problem list, medications, allergies, family history and social history were all reviewed and documented as reviewed in the EPIC chart.  ROS:  Performed with pertinent positives and negatives included in the history, assessment and plan.   Additional significant findings :  none   Exam: Caryn Bee assistant Filed Vitals:   03/09/15 0952  BP: 124/80  Height: 5\' 3"  (1.6 m)  Weight: 166 lb (75.297 kg)   General appearance:  Normal affect, orientation and appearance. Skin: Grossly normal HEENT: Without gross lesions.  No cervical or supraclavicular adenopathy. Thyroid normal.  Lungs:  Clear without wheezing, rales or rhonchi Cardiac: RR, without RMG Abdominal:  Soft, nontender, without masses, guarding, rebound, organomegaly or hernia Breasts:  Examined lying and sitting. Left without masses, retractions, discharge or axillary adenopathy.  Right lumpectomy scar retraction, old changes no masses, discharge or adenopathy Pelvic:  Ext/BUS/vagina with atrophic changes  Adnexa without masses or tenderness    Anus and perineum normal   Rectovaginal normal sphincter tone without palpated masses or tenderness.    Assessment/Plan:  65 y.o. G0P0 female for breast and pelvic exam.   1. Postmenopausal/atrophic genital changes. Status post TAH/BSO in the past. Doing well without significant hot flashes or night sweats. 2. Intermittent vulvar itching. Not consistent. Exam today shows atrophic changes but otherwise normal. Had prescribed Mycolog last year but she never filled it. Represcribed this year she'll use it to see if that doesn't resolve the itching. If it does not she'll represent for further evaluation. 3. Right-sided breast cancer.  Exam NED. Mammography 09/2014. Continue with annual mammography. SBE monthly reviewed. 4. Osteopenia. DEXA T score -2.411/2014.  FRAX 7.9%/0.6%. Repeat DEXA now patient agrees to schedule and follow up for this. Increased calcium and vitamin D reviewed. 5. Colonoscopy 2014. Repeat at their recommended interval. 6. Pap smear 11/2013. No Pap smear done today. No history of significant abnormal Pap smears. Options to stop screening altogether or less frequent screening intervals reviewed. Will readdress on annual basis. 7. Health maintenance. No routine blood work done as patient reports this done at her primary physician's office. Follow up 4 bone density otherwise 1 year, sooner as needed   Anastasio Auerbach MD, 10:25 AM 03/09/2015

## 2015-03-10 LAB — URINALYSIS W MICROSCOPIC + REFLEX CULTURE
Bacteria, UA: NONE SEEN [HPF]
Bilirubin Urine: NEGATIVE
CASTS: NONE SEEN [LPF]
Crystals: NONE SEEN [HPF]
GLUCOSE, UA: NEGATIVE
HGB URINE DIPSTICK: NEGATIVE
KETONES UR: NEGATIVE
LEUKOCYTES UA: NEGATIVE
NITRITE: NEGATIVE
PH: 7.5 (ref 5.0–8.0)
Protein, ur: NEGATIVE
SPECIFIC GRAVITY, URINE: 1.024 (ref 1.001–1.035)
WBC, UA: NONE SEEN WBC/HPF (ref <=5)
Yeast: NONE SEEN [HPF]

## 2015-03-11 LAB — URINE CULTURE: Colony Count: 80000

## 2015-03-14 DIAGNOSIS — M8580 Other specified disorders of bone density and structure, unspecified site: Secondary | ICD-10-CM

## 2015-03-14 HISTORY — DX: Other specified disorders of bone density and structure, unspecified site: M85.80

## 2015-03-20 ENCOUNTER — Ambulatory Visit (INDEPENDENT_AMBULATORY_CARE_PROVIDER_SITE_OTHER): Payer: Medicare Other

## 2015-03-20 ENCOUNTER — Other Ambulatory Visit: Payer: Self-pay | Admitting: Gynecology

## 2015-03-20 ENCOUNTER — Encounter: Payer: Self-pay | Admitting: Gynecology

## 2015-03-20 DIAGNOSIS — M899 Disorder of bone, unspecified: Secondary | ICD-10-CM

## 2015-03-20 DIAGNOSIS — M8580 Other specified disorders of bone density and structure, unspecified site: Secondary | ICD-10-CM

## 2015-04-10 DIAGNOSIS — Z0000 Encounter for general adult medical examination without abnormal findings: Secondary | ICD-10-CM | POA: Diagnosis not present

## 2015-04-10 DIAGNOSIS — H5212 Myopia, left eye: Secondary | ICD-10-CM | POA: Diagnosis not present

## 2015-04-10 DIAGNOSIS — H4032X4 Glaucoma secondary to eye trauma, left eye, indeterminate stage: Secondary | ICD-10-CM | POA: Diagnosis not present

## 2015-04-10 DIAGNOSIS — H25011 Cortical age-related cataract, right eye: Secondary | ICD-10-CM | POA: Diagnosis not present

## 2015-04-10 DIAGNOSIS — M859 Disorder of bone density and structure, unspecified: Secondary | ICD-10-CM | POA: Diagnosis not present

## 2015-04-12 DIAGNOSIS — Z1212 Encounter for screening for malignant neoplasm of rectum: Secondary | ICD-10-CM | POA: Diagnosis not present

## 2015-04-17 DIAGNOSIS — F4321 Adjustment disorder with depressed mood: Secondary | ICD-10-CM | POA: Diagnosis not present

## 2015-04-17 DIAGNOSIS — Z1389 Encounter for screening for other disorder: Secondary | ICD-10-CM | POA: Diagnosis not present

## 2015-04-17 DIAGNOSIS — Z0131 Encounter for examination of blood pressure with abnormal findings: Secondary | ICD-10-CM | POA: Diagnosis not present

## 2015-04-17 DIAGNOSIS — H3320 Serous retinal detachment, unspecified eye: Secondary | ICD-10-CM | POA: Diagnosis not present

## 2015-04-17 DIAGNOSIS — Z0000 Encounter for general adult medical examination without abnormal findings: Secondary | ICD-10-CM | POA: Diagnosis not present

## 2015-04-17 DIAGNOSIS — J309 Allergic rhinitis, unspecified: Secondary | ICD-10-CM | POA: Diagnosis not present

## 2015-04-17 DIAGNOSIS — M8580 Other specified disorders of bone density and structure, unspecified site: Secondary | ICD-10-CM | POA: Diagnosis not present

## 2015-04-17 DIAGNOSIS — Z23 Encounter for immunization: Secondary | ICD-10-CM | POA: Diagnosis not present

## 2015-04-17 DIAGNOSIS — C50911 Malignant neoplasm of unspecified site of right female breast: Secondary | ICD-10-CM | POA: Diagnosis not present

## 2015-06-08 DIAGNOSIS — S30860A Insect bite (nonvenomous) of lower back and pelvis, initial encounter: Secondary | ICD-10-CM | POA: Diagnosis not present

## 2015-06-08 DIAGNOSIS — Z6828 Body mass index (BMI) 28.0-28.9, adult: Secondary | ICD-10-CM | POA: Diagnosis not present

## 2015-06-18 DIAGNOSIS — Z0131 Encounter for examination of blood pressure with abnormal findings: Secondary | ICD-10-CM | POA: Diagnosis not present

## 2015-06-18 DIAGNOSIS — Z6828 Body mass index (BMI) 28.0-28.9, adult: Secondary | ICD-10-CM | POA: Diagnosis not present

## 2015-07-03 DIAGNOSIS — H2511 Age-related nuclear cataract, right eye: Secondary | ICD-10-CM | POA: Diagnosis not present

## 2015-07-03 DIAGNOSIS — H43391 Other vitreous opacities, right eye: Secondary | ICD-10-CM | POA: Diagnosis not present

## 2015-07-03 DIAGNOSIS — H31009 Unspecified chorioretinal scars, unspecified eye: Secondary | ICD-10-CM | POA: Diagnosis not present

## 2015-07-03 DIAGNOSIS — H43811 Vitreous degeneration, right eye: Secondary | ICD-10-CM | POA: Diagnosis not present

## 2015-07-20 DIAGNOSIS — M7582 Other shoulder lesions, left shoulder: Secondary | ICD-10-CM | POA: Diagnosis not present

## 2015-08-01 DIAGNOSIS — M7542 Impingement syndrome of left shoulder: Secondary | ICD-10-CM | POA: Diagnosis not present

## 2015-08-08 DIAGNOSIS — M7542 Impingement syndrome of left shoulder: Secondary | ICD-10-CM | POA: Diagnosis not present

## 2015-08-15 DIAGNOSIS — M7542 Impingement syndrome of left shoulder: Secondary | ICD-10-CM | POA: Diagnosis not present

## 2015-08-22 DIAGNOSIS — M7542 Impingement syndrome of left shoulder: Secondary | ICD-10-CM | POA: Diagnosis not present

## 2015-08-29 DIAGNOSIS — M7542 Impingement syndrome of left shoulder: Secondary | ICD-10-CM | POA: Diagnosis not present

## 2015-09-13 DIAGNOSIS — M7582 Other shoulder lesions, left shoulder: Secondary | ICD-10-CM | POA: Diagnosis not present

## 2015-09-18 ENCOUNTER — Other Ambulatory Visit: Payer: Self-pay | Admitting: Internal Medicine

## 2015-09-18 DIAGNOSIS — Z1231 Encounter for screening mammogram for malignant neoplasm of breast: Secondary | ICD-10-CM

## 2015-10-10 ENCOUNTER — Ambulatory Visit
Admission: RE | Admit: 2015-10-10 | Discharge: 2015-10-10 | Disposition: A | Discharge status: Home / Self Care | Payer: Medicare Other | Source: Ambulatory Visit | Attending: Internal Medicine | Admitting: Internal Medicine

## 2015-10-10 DIAGNOSIS — Z1231 Encounter for screening mammogram for malignant neoplasm of breast: Secondary | ICD-10-CM

## 2016-03-10 ENCOUNTER — Ambulatory Visit (INDEPENDENT_AMBULATORY_CARE_PROVIDER_SITE_OTHER): Payer: Medicare Other | Admitting: Gynecology

## 2016-03-10 ENCOUNTER — Encounter: Payer: Self-pay | Admitting: Gynecology

## 2016-03-10 VITALS — BP 122/78 | Ht 63.0 in | Wt 163.0 lb

## 2016-03-10 DIAGNOSIS — Z23 Encounter for immunization: Secondary | ICD-10-CM

## 2016-03-10 DIAGNOSIS — Z01411 Encounter for gynecological examination (general) (routine) with abnormal findings: Secondary | ICD-10-CM

## 2016-03-10 DIAGNOSIS — M8580 Other specified disorders of bone density and structure, unspecified site: Secondary | ICD-10-CM

## 2016-03-10 DIAGNOSIS — N952 Postmenopausal atrophic vaginitis: Secondary | ICD-10-CM

## 2016-03-10 DIAGNOSIS — C50919 Malignant neoplasm of unspecified site of unspecified female breast: Secondary | ICD-10-CM

## 2016-03-10 NOTE — Progress Notes (Signed)
    CRISTINE MARTINCIC Jun 14, 1950 [id number]        66 y.o.  G0P0 for breast and pelvic exam  Past medical history,surgical history, problem list, medications, allergies, family history and social history were all reviewed and documented as reviewed in the EPIC chart.  ROS:  Performed with pertinent positives and negatives included in the history, assessment and plan.   Additional significant findings :  None   Exam: Caryn Bee assistant Vitals:   03/10/16 1013  BP: 122/78  Weight: 163 lb (73.9 kg)  Height: 5\' 3"  (1.6 m)   Body mass index is 28.87 kg/m.  General appearance:  Normal affect, orientation and appearance. Skin: Grossly normal HEENT: Without gross lesions.  No cervical or supraclavicular adenopathy. Thyroid normal.  Lungs:  Clear without wheezing, rales or rhonchi Cardiac: RR, without RMG Abdominal:  Soft, nontender, without masses, guarding, rebound, organomegaly or hernia Breasts:  Examined lying and sitting without masses, retractions, discharge or axillary adenopathy.  Well-healed right lumpectomy scar Pelvic:  Ext, BUS, Vagina: With atrophic changes  Adnexa: Without masses or tenderness    Anus and perineum: Normal   Rectovaginal: Normal sphincter tone without palpated masses or tenderness.    Assessment/Plan:  66 y.o. G0P0 female for breast and pelvic exam.   1. Postmenopausal/atrophic genital changes. Status post TVH/BSO in the past. Doing well without significant hot flashes/night sweats/vaginal dryness 2. History of right-sided breast cancer. Mammography 09/2015. Continue with annual mammography when due. Exam NED. SBE monthly reviewed. 3. Pap smear 11/2013. No Pap smear done today. No history of significant abnormal Pap smears. History of hysterectomy for benign indications, over the age 66. Options to stop screening per current screening guidelines reviewed. Will readdress on annual basis. 4. Colonoscopy 2014. Repeat at their recommended  interval. 5. Osteopenia. DEXA 03/2015. T score -2.4. FRAX 7%/0.5%. Stable from prior DEXA. Plan repeat DEXA next year at 2 year interval. 6. Health maintenance. No routine lab work done as patient reports this done elsewhere. Follow up 1 year, sooner as needed.   Anastasio Auerbach MD, 10:45 AM 03/10/2016

## 2016-03-10 NOTE — Patient Instructions (Signed)

## 2016-03-10 NOTE — Addendum Note (Signed)
Addended by: Nelva Nay on: 03/10/2016 11:57 AM   Modules accepted: Orders

## 2016-04-14 DIAGNOSIS — H5213 Myopia, bilateral: Secondary | ICD-10-CM | POA: Diagnosis not present

## 2016-04-14 DIAGNOSIS — H25011 Cortical age-related cataract, right eye: Secondary | ICD-10-CM | POA: Diagnosis not present

## 2016-04-14 DIAGNOSIS — H4032X4 Glaucoma secondary to eye trauma, left eye, indeterminate stage: Secondary | ICD-10-CM | POA: Diagnosis not present

## 2016-04-21 DIAGNOSIS — M859 Disorder of bone density and structure, unspecified: Secondary | ICD-10-CM | POA: Diagnosis not present

## 2016-04-21 DIAGNOSIS — Z0000 Encounter for general adult medical examination without abnormal findings: Secondary | ICD-10-CM | POA: Diagnosis not present

## 2016-04-28 DIAGNOSIS — Z23 Encounter for immunization: Secondary | ICD-10-CM | POA: Diagnosis not present

## 2016-04-28 DIAGNOSIS — M859 Disorder of bone density and structure, unspecified: Secondary | ICD-10-CM | POA: Diagnosis not present

## 2016-04-28 DIAGNOSIS — C50911 Malignant neoplasm of unspecified site of right female breast: Secondary | ICD-10-CM | POA: Diagnosis not present

## 2016-04-28 DIAGNOSIS — F4321 Adjustment disorder with depressed mood: Secondary | ICD-10-CM | POA: Diagnosis not present

## 2016-04-28 DIAGNOSIS — Z1389 Encounter for screening for other disorder: Secondary | ICD-10-CM | POA: Diagnosis not present

## 2016-04-28 DIAGNOSIS — Z0000 Encounter for general adult medical examination without abnormal findings: Secondary | ICD-10-CM | POA: Diagnosis not present

## 2016-04-28 DIAGNOSIS — J3089 Other allergic rhinitis: Secondary | ICD-10-CM | POA: Diagnosis not present

## 2016-04-28 DIAGNOSIS — H3320 Serous retinal detachment, unspecified eye: Secondary | ICD-10-CM | POA: Diagnosis not present

## 2016-05-01 DIAGNOSIS — Z1212 Encounter for screening for malignant neoplasm of rectum: Secondary | ICD-10-CM | POA: Diagnosis not present

## 2016-07-02 DIAGNOSIS — H31009 Unspecified chorioretinal scars, unspecified eye: Secondary | ICD-10-CM | POA: Diagnosis not present

## 2016-07-02 DIAGNOSIS — H35352 Cystoid macular degeneration, left eye: Secondary | ICD-10-CM | POA: Diagnosis not present

## 2016-07-02 DIAGNOSIS — H43811 Vitreous degeneration, right eye: Secondary | ICD-10-CM | POA: Diagnosis not present

## 2016-07-02 DIAGNOSIS — H43391 Other vitreous opacities, right eye: Secondary | ICD-10-CM | POA: Diagnosis not present

## 2016-07-02 DIAGNOSIS — H2511 Age-related nuclear cataract, right eye: Secondary | ICD-10-CM | POA: Diagnosis not present

## 2016-09-04 ENCOUNTER — Other Ambulatory Visit: Payer: Self-pay | Admitting: Internal Medicine

## 2016-09-04 DIAGNOSIS — Z1231 Encounter for screening mammogram for malignant neoplasm of breast: Secondary | ICD-10-CM

## 2016-10-10 ENCOUNTER — Encounter (INDEPENDENT_AMBULATORY_CARE_PROVIDER_SITE_OTHER): Payer: Self-pay

## 2016-10-10 ENCOUNTER — Ambulatory Visit
Admission: RE | Admit: 2016-10-10 | Discharge: 2016-10-10 | Disposition: A | Discharge status: Home / Self Care | Payer: Medicare Other | Source: Ambulatory Visit | Attending: Internal Medicine | Admitting: Internal Medicine

## 2016-10-10 DIAGNOSIS — Z1231 Encounter for screening mammogram for malignant neoplasm of breast: Secondary | ICD-10-CM | POA: Diagnosis not present

## 2016-11-07 DIAGNOSIS — J069 Acute upper respiratory infection, unspecified: Secondary | ICD-10-CM | POA: Diagnosis not present

## 2016-11-07 DIAGNOSIS — R05 Cough: Secondary | ICD-10-CM | POA: Diagnosis not present

## 2016-11-07 DIAGNOSIS — Z6829 Body mass index (BMI) 29.0-29.9, adult: Secondary | ICD-10-CM | POA: Diagnosis not present

## 2016-11-07 DIAGNOSIS — R6883 Chills (without fever): Secondary | ICD-10-CM | POA: Diagnosis not present

## 2017-02-04 DIAGNOSIS — Z6829 Body mass index (BMI) 29.0-29.9, adult: Secondary | ICD-10-CM | POA: Diagnosis not present

## 2017-02-04 DIAGNOSIS — S60551A Superficial foreign body of right hand, initial encounter: Secondary | ICD-10-CM | POA: Diagnosis not present

## 2017-02-04 DIAGNOSIS — M79641 Pain in right hand: Secondary | ICD-10-CM | POA: Diagnosis not present

## 2017-02-04 DIAGNOSIS — S60450A Superficial foreign body of right index finger, initial encounter: Secondary | ICD-10-CM | POA: Diagnosis not present

## 2017-02-11 DIAGNOSIS — M19041 Primary osteoarthritis, right hand: Secondary | ICD-10-CM | POA: Diagnosis not present

## 2017-02-11 DIAGNOSIS — M79641 Pain in right hand: Secondary | ICD-10-CM | POA: Diagnosis not present

## 2017-02-11 DIAGNOSIS — S60551D Superficial foreign body of right hand, subsequent encounter: Secondary | ICD-10-CM | POA: Diagnosis not present

## 2017-02-11 DIAGNOSIS — M19049 Primary osteoarthritis, unspecified hand: Secondary | ICD-10-CM | POA: Diagnosis not present

## 2017-03-12 ENCOUNTER — Ambulatory Visit (INDEPENDENT_AMBULATORY_CARE_PROVIDER_SITE_OTHER): Payer: Medicare Other | Admitting: Gynecology

## 2017-03-12 ENCOUNTER — Encounter: Payer: Self-pay | Admitting: Gynecology

## 2017-03-12 VITALS — BP 124/80 | Ht 63.0 in | Wt 163.0 lb

## 2017-03-12 DIAGNOSIS — Z01411 Encounter for gynecological examination (general) (routine) with abnormal findings: Secondary | ICD-10-CM | POA: Diagnosis not present

## 2017-03-12 DIAGNOSIS — M8580 Other specified disorders of bone density and structure, unspecified site: Secondary | ICD-10-CM

## 2017-03-12 DIAGNOSIS — Z1272 Encounter for screening for malignant neoplasm of vagina: Secondary | ICD-10-CM | POA: Diagnosis not present

## 2017-03-12 DIAGNOSIS — N952 Postmenopausal atrophic vaginitis: Secondary | ICD-10-CM

## 2017-03-12 NOTE — Patient Instructions (Signed)
Followup for bone density as scheduled. 

## 2017-03-12 NOTE — Progress Notes (Addendum)
    Norma Mayer [birth year] [id number]        66 y.o.  G0P0 for breast and pelvic exam  Past medical history,surgical history, problem list, medications, allergies, family history and social history were all reviewed and documented as reviewed in the EPIC chart.  ROS:  Performed with pertinent positives and negatives included in the history, assessment and plan.   Additional significant findings : None   Exam: Caryn Bee assistant Vitals:   03/12/17 1135  BP: 124/80  Weight: 163 lb (73.9 kg)  Height: 5\' 3"  (1.6 m)   Body mass index is 28.87 kg/m.  General appearance:  Normal affect, orientation and appearance. Skin: Grossly normal HEENT: Without gross lesions.  No cervical or supraclavicular adenopathy. Thyroid normal.  Lungs:  Clear without wheezing, rales or rhonchi Cardiac: RR, without RMG Abdominal:  Soft, nontender, without masses, guarding, rebound, organomegaly or hernia Breasts:  Examined lying and sitting without masses, retractions, discharge or axillary adenopathy.  Well-healed right lumpectomy scar with old distortion Pelvic:  Ext, BUS, Vagina: With atrophic changes.  Pap smear done  Adnexa: Without masses or tenderness    Anus and perineum: Normal   Rectovaginal: Normal sphincter tone without palpated masses or tenderness.    Assessment/Plan:  67 y.o. G0P0 female for breast and pelvic exam status post TAH/BSO in the past.   1. Postmenopausal/atrophic genital changes.  Doing well without significant hot flushes, night sweats or vaginal dryness. 2. Osteopenia.  DEXA 2017 T score -2.4 FRAX 7% / 0.5%.  Stable from prior DEXA.  Plan repeat DEXA now a 2-year interval.  Patient will schedule in follow-up for this. 3. Right-sided breast cancer.  Exam NED.  Mammography 09/2016.  Continue with annual mammography when due. 4. Pap smear 2015.  Pap smear done today.  No history of abnormal Pap smears.  Discussed stop screening recommendations based on age and  hysterectomy history.  Will readdress on an annual basis. 5. Colonoscopy 2014.  Repeat at their recommended interval. 6. Health maintenance.  No routine lab work done as patient does this elsewhere.  Follow-up 1 for bone density otherwise 1 year, sooner as needed.   Anastasio Auerbach MD, 12:12 PM 03/12/2017    3

## 2017-03-12 NOTE — Addendum Note (Signed)
Addended by: Nelva Nay on: 03/12/2017 12:36 PM   Modules accepted: Orders

## 2017-03-16 LAB — PAP IG W/ RFLX HPV ASCU

## 2017-04-07 ENCOUNTER — Ambulatory Visit (INDEPENDENT_AMBULATORY_CARE_PROVIDER_SITE_OTHER): Payer: Medicare Other

## 2017-04-07 ENCOUNTER — Other Ambulatory Visit: Payer: Self-pay | Admitting: Gynecology

## 2017-04-07 ENCOUNTER — Telehealth: Payer: Self-pay | Admitting: Gynecology

## 2017-04-07 DIAGNOSIS — M810 Age-related osteoporosis without current pathological fracture: Secondary | ICD-10-CM

## 2017-04-07 DIAGNOSIS — M8580 Other specified disorders of bone density and structure, unspecified site: Secondary | ICD-10-CM

## 2017-04-07 NOTE — Telephone Encounter (Signed)
Tell patient that her most recent bone density shows osteoporosis when looking at her distal arm.  Other measurements are not so bad.  Recommend office visit so that we can discuss her report and discuss whether medication is indicated.

## 2017-04-08 NOTE — Telephone Encounter (Signed)
Pt informed

## 2017-04-14 ENCOUNTER — Ambulatory Visit (INDEPENDENT_AMBULATORY_CARE_PROVIDER_SITE_OTHER): Payer: Medicare Other | Admitting: Gynecology

## 2017-04-14 ENCOUNTER — Encounter: Payer: Self-pay | Admitting: Gynecology

## 2017-04-14 VITALS — BP 130/82

## 2017-04-14 DIAGNOSIS — M810 Age-related osteoporosis without current pathological fracture: Secondary | ICD-10-CM | POA: Diagnosis not present

## 2017-04-14 NOTE — Patient Instructions (Signed)
Follow-up when due for annual exam.  Keep track of your vitamin D level through your primary physician's office.

## 2017-04-14 NOTE — Progress Notes (Signed)
    Norma Mayer 1950-07-13 [id number]        66 y.o.  G0P0 presents to discuss her most recent bone density which shows T score -2.5 distal third of radius.  Had previously been -2.4.  A FRAX had not been calculated due to the diagnosis of osteoporosis but I calculated a FRAX today using her femur data and her FRAX was 5.3% / 1.3%.  Her spine is not measured due to degenerative changes along with scoliosis.  Past medical history,surgical history, problem list, medications, allergies, family history and social history were all reviewed and documented in the EPIC chart.  Directed ROS with pertinent positives and negatives documented in the history of present illness/assessment and plan.  Exam: Vitals:   04/14/17 1126  BP: 130/82   General appearance:  Normal  Assessment/Plan:  67 y.o. G0P0 the patient and I had an extensive discussion about the diagnosis of osteoporosis including the use of the distal third of the radius as not the most optimal site and also noting that her femoral measurements were not bad.  Her FRAX showed a low 10-year predicted fracture risk.  The issues as to whether to treat with medication based on her distal third of the radius with relatively good femoral measurements noting her spine is not used due to degenerative and scoliosis changes.  After a discussion about the pros and cons of treatment versus nontreatment and also recognizing that she is becoming more aggressive in her vitamin D and calcium management as well as beginning a more vigorous exercise program on a regular basis as recommended by her primary physician for weight control and bone health we both agree not to treat with medication at this time.  We will plan on repeating her bone density in 2 years and then we will go from there.  The patient is having her vitamin D level measured through her primary physician's office and will continue to do so.  Greater than 50% of my 20 minute visit was spent in  direct face to face counseling and coordination of care with the patient.     Anastasio Auerbach MD, 12:05 PM 04/14/2017

## 2017-04-22 DIAGNOSIS — H4032X4 Glaucoma secondary to eye trauma, left eye, indeterminate stage: Secondary | ICD-10-CM | POA: Diagnosis not present

## 2017-04-22 DIAGNOSIS — H25011 Cortical age-related cataract, right eye: Secondary | ICD-10-CM | POA: Diagnosis not present

## 2017-04-22 DIAGNOSIS — H5213 Myopia, bilateral: Secondary | ICD-10-CM | POA: Diagnosis not present

## 2017-04-24 DIAGNOSIS — R194 Change in bowel habit: Secondary | ICD-10-CM | POA: Diagnosis not present

## 2017-04-24 DIAGNOSIS — Z0000 Encounter for general adult medical examination without abnormal findings: Secondary | ICD-10-CM | POA: Diagnosis not present

## 2017-04-24 DIAGNOSIS — M859 Disorder of bone density and structure, unspecified: Secondary | ICD-10-CM | POA: Diagnosis not present

## 2017-04-24 DIAGNOSIS — R82998 Other abnormal findings in urine: Secondary | ICD-10-CM | POA: Diagnosis not present

## 2017-04-29 DIAGNOSIS — Z1389 Encounter for screening for other disorder: Secondary | ICD-10-CM | POA: Diagnosis not present

## 2017-04-29 DIAGNOSIS — J309 Allergic rhinitis, unspecified: Secondary | ICD-10-CM | POA: Diagnosis not present

## 2017-04-29 DIAGNOSIS — H3320 Serous retinal detachment, unspecified eye: Secondary | ICD-10-CM | POA: Diagnosis not present

## 2017-04-29 DIAGNOSIS — F4321 Adjustment disorder with depressed mood: Secondary | ICD-10-CM | POA: Diagnosis not present

## 2017-04-29 DIAGNOSIS — R0789 Other chest pain: Secondary | ICD-10-CM | POA: Diagnosis not present

## 2017-04-29 DIAGNOSIS — M810 Age-related osteoporosis without current pathological fracture: Secondary | ICD-10-CM | POA: Diagnosis not present

## 2017-04-29 DIAGNOSIS — Z0000 Encounter for general adult medical examination without abnormal findings: Secondary | ICD-10-CM | POA: Diagnosis not present

## 2017-04-29 DIAGNOSIS — Z6829 Body mass index (BMI) 29.0-29.9, adult: Secondary | ICD-10-CM | POA: Diagnosis not present

## 2017-04-29 DIAGNOSIS — C50911 Malignant neoplasm of unspecified site of right female breast: Secondary | ICD-10-CM | POA: Diagnosis not present

## 2017-04-30 ENCOUNTER — Telehealth: Payer: Self-pay | Admitting: Gynecology

## 2017-04-30 ENCOUNTER — Encounter: Payer: Self-pay | Admitting: *Deleted

## 2017-04-30 NOTE — Telephone Encounter (Signed)
Sent mychart message

## 2017-04-30 NOTE — Telephone Encounter (Signed)
Tell patient I got a copy of her labs from [facility].  Her vitamin D level is low.  I would do whatever they recommend but if they did not discuss her vitamin D with her than I would supplement with 2000 units daily OTC and recheck a value in 3 months.  They may suggest a different amount and I would go with that since they are following her but I did not want this to go unnoticed.

## 2017-05-05 DIAGNOSIS — Z1212 Encounter for screening for malignant neoplasm of rectum: Secondary | ICD-10-CM | POA: Diagnosis not present

## 2017-08-28 ENCOUNTER — Other Ambulatory Visit: Payer: Self-pay | Admitting: Internal Medicine

## 2017-08-28 DIAGNOSIS — Z1231 Encounter for screening mammogram for malignant neoplasm of breast: Secondary | ICD-10-CM

## 2017-09-01 DIAGNOSIS — H31009 Unspecified chorioretinal scars, unspecified eye: Secondary | ICD-10-CM | POA: Diagnosis not present

## 2017-09-01 DIAGNOSIS — H43391 Other vitreous opacities, right eye: Secondary | ICD-10-CM | POA: Diagnosis not present

## 2017-09-01 DIAGNOSIS — H43811 Vitreous degeneration, right eye: Secondary | ICD-10-CM | POA: Diagnosis not present

## 2017-09-01 DIAGNOSIS — H2511 Age-related nuclear cataract, right eye: Secondary | ICD-10-CM | POA: Diagnosis not present

## 2017-10-21 ENCOUNTER — Ambulatory Visit
Admission: RE | Admit: 2017-10-21 | Discharge: 2017-10-21 | Disposition: A | Discharge status: Home / Self Care | Payer: Medicare Other | Source: Ambulatory Visit | Attending: Internal Medicine | Admitting: Internal Medicine

## 2017-10-21 DIAGNOSIS — Z1231 Encounter for screening mammogram for malignant neoplasm of breast: Secondary | ICD-10-CM

## 2017-12-23 ENCOUNTER — Ambulatory Visit (INDEPENDENT_AMBULATORY_CARE_PROVIDER_SITE_OTHER): Payer: Medicare Other | Admitting: Gynecology

## 2017-12-23 DIAGNOSIS — Z23 Encounter for immunization: Secondary | ICD-10-CM

## 2018-02-15 DIAGNOSIS — L720 Epidermal cyst: Secondary | ICD-10-CM | POA: Diagnosis not present

## 2018-02-15 DIAGNOSIS — D0471 Carcinoma in situ of skin of right lower limb, including hip: Secondary | ICD-10-CM | POA: Diagnosis not present

## 2018-02-15 DIAGNOSIS — D225 Melanocytic nevi of trunk: Secondary | ICD-10-CM | POA: Diagnosis not present

## 2018-02-15 DIAGNOSIS — L738 Other specified follicular disorders: Secondary | ICD-10-CM | POA: Diagnosis not present

## 2018-02-15 DIAGNOSIS — D1801 Hemangioma of skin and subcutaneous tissue: Secondary | ICD-10-CM | POA: Diagnosis not present

## 2018-02-15 DIAGNOSIS — Z85828 Personal history of other malignant neoplasm of skin: Secondary | ICD-10-CM | POA: Diagnosis not present

## 2018-02-15 DIAGNOSIS — D485 Neoplasm of uncertain behavior of skin: Secondary | ICD-10-CM | POA: Diagnosis not present

## 2018-02-15 DIAGNOSIS — L821 Other seborrheic keratosis: Secondary | ICD-10-CM | POA: Diagnosis not present

## 2018-03-17 ENCOUNTER — Encounter: Payer: Self-pay | Admitting: Gynecology

## 2018-03-17 ENCOUNTER — Ambulatory Visit (INDEPENDENT_AMBULATORY_CARE_PROVIDER_SITE_OTHER): Payer: Medicare Other | Admitting: Gynecology

## 2018-03-17 VITALS — BP 138/88 | Ht 63.0 in | Wt 166.0 lb

## 2018-03-17 DIAGNOSIS — N952 Postmenopausal atrophic vaginitis: Secondary | ICD-10-CM

## 2018-03-17 DIAGNOSIS — Z01419 Encounter for gynecological examination (general) (routine) without abnormal findings: Secondary | ICD-10-CM | POA: Diagnosis not present

## 2018-03-17 DIAGNOSIS — Z853 Personal history of malignant neoplasm of breast: Secondary | ICD-10-CM

## 2018-03-17 DIAGNOSIS — M810 Age-related osteoporosis without current pathological fracture: Secondary | ICD-10-CM

## 2018-03-17 NOTE — Progress Notes (Signed)
    Norma Mayer 1950-12-04 [id number]        67 y.o.  G0P0 for breast and pelvic exam.  Without gynecologic complaints  Past medical history,surgical history, problem list, medications, allergies, family history and social history were all reviewed and documented as reviewed in the EPIC chart.  ROS:  Performed with pertinent positives and negatives included in the history, assessment and plan.   Additional significant findings : None   Exam: Caryn Bee assistant Vitals:   03/17/18 1148  BP: 138/88  Weight: 166 lb (75.3 kg)  Height: 5\' 3"  (1.6 m)   Body mass index is 29.41 kg/m.  General appearance:  Normal affect, orientation and appearance. Skin: Grossly normal HEENT: Without gross lesions.  No cervical or supraclavicular adenopathy. Thyroid normal.  Lungs:  Clear without wheezing, rales or rhonchi Cardiac: RR, without RMG Abdominal:  Soft, nontender, without masses, guarding, rebound, organomegaly or hernia Breasts:  Examined lying and sitting without masses, retractions, discharge or axillary adenopathy.  Well-healed right lumpectomy scar with old distortion Pelvic:  Ext, BUS, Vagina: With atrophic changes  Adnexa: Without masses or tenderness    Anus and perineum: Normal   Rectovaginal: Normal sphincter tone without palpated masses or tenderness.    Assessment/Plan:  68 y.o. G0P0 female for breast and pelvic exam.  Status post TAH BSO in the past  1. Postmenopausal.  No significant menopausal symptoms. 2. Osteoporosis.  DEXA 2019 T score -2.5 distal radius.  FRAX calculated excluding the wrist was 5% / 1.3%.  We had discussed treatment options as per the 04/14/2017 note and she elected not to be treated but to recheck her bone density next year at 2-year interval.  Maximizing calcium/vitamin D and walking. 3. History of right-sided breast cancer.  Exam NED.  Mammography 10/2017.  Continue with annual mammography when due. 4. Pap smear 2019.  No Pap smear done today.   No history of significant abnormal Pap smears.  Options to stop screening per current screening guidelines reviewed based on hysterectomy and age.  Will readdress on an annual basis. 5. Colonoscopy 2014.  Repeat at their recommended interval. 6. Health maintenance.  No routine lab work done as patient does this elsewhere.  Discussed mild elevated blood pressure 138/88.  She will follow-up to have this rechecked in a non-exam situation and follow-up with Dr. Dagmar Hait if it remains elevated.  Follow-up in 1 year for annual exam.   Anastasio Auerbach MD, 12:52 PM 03/17/2018

## 2018-03-17 NOTE — Patient Instructions (Signed)
Follow-up in 1 year for annual exam 

## 2018-05-26 DIAGNOSIS — M722 Plantar fascial fibromatosis: Secondary | ICD-10-CM | POA: Diagnosis not present

## 2018-05-26 DIAGNOSIS — M2041 Other hammer toe(s) (acquired), right foot: Secondary | ICD-10-CM | POA: Diagnosis not present

## 2018-05-26 DIAGNOSIS — M79671 Pain in right foot: Secondary | ICD-10-CM | POA: Diagnosis not present

## 2018-05-26 DIAGNOSIS — M7741 Metatarsalgia, right foot: Secondary | ICD-10-CM | POA: Diagnosis not present

## 2018-05-28 DIAGNOSIS — R7989 Other specified abnormal findings of blood chemistry: Secondary | ICD-10-CM | POA: Diagnosis not present

## 2018-05-28 DIAGNOSIS — R82998 Other abnormal findings in urine: Secondary | ICD-10-CM | POA: Diagnosis not present

## 2018-05-28 DIAGNOSIS — M810 Age-related osteoporosis without current pathological fracture: Secondary | ICD-10-CM | POA: Diagnosis not present

## 2018-05-31 DIAGNOSIS — Z85828 Personal history of other malignant neoplasm of skin: Secondary | ICD-10-CM | POA: Diagnosis not present

## 2018-05-31 DIAGNOSIS — L720 Epidermal cyst: Secondary | ICD-10-CM | POA: Diagnosis not present

## 2018-05-31 DIAGNOSIS — L918 Other hypertrophic disorders of the skin: Secondary | ICD-10-CM | POA: Diagnosis not present

## 2018-05-31 DIAGNOSIS — L570 Actinic keratosis: Secondary | ICD-10-CM | POA: Diagnosis not present

## 2018-05-31 DIAGNOSIS — D485 Neoplasm of uncertain behavior of skin: Secondary | ICD-10-CM | POA: Diagnosis not present

## 2018-05-31 DIAGNOSIS — L817 Pigmented purpuric dermatosis: Secondary | ICD-10-CM | POA: Diagnosis not present

## 2018-05-31 DIAGNOSIS — L821 Other seborrheic keratosis: Secondary | ICD-10-CM | POA: Diagnosis not present

## 2018-06-01 DIAGNOSIS — H4032X4 Glaucoma secondary to eye trauma, left eye, indeterminate stage: Secondary | ICD-10-CM | POA: Diagnosis not present

## 2018-06-01 DIAGNOSIS — H524 Presbyopia: Secondary | ICD-10-CM | POA: Diagnosis not present

## 2018-06-01 DIAGNOSIS — H25011 Cortical age-related cataract, right eye: Secondary | ICD-10-CM | POA: Diagnosis not present

## 2018-06-03 DIAGNOSIS — Z1331 Encounter for screening for depression: Secondary | ICD-10-CM | POA: Diagnosis not present

## 2018-06-03 DIAGNOSIS — M810 Age-related osteoporosis without current pathological fracture: Secondary | ICD-10-CM | POA: Diagnosis not present

## 2018-06-03 DIAGNOSIS — Z1339 Encounter for screening examination for other mental health and behavioral disorders: Secondary | ICD-10-CM | POA: Diagnosis not present

## 2018-06-03 DIAGNOSIS — J309 Allergic rhinitis, unspecified: Secondary | ICD-10-CM | POA: Diagnosis not present

## 2018-06-03 DIAGNOSIS — M79673 Pain in unspecified foot: Secondary | ICD-10-CM | POA: Diagnosis not present

## 2018-06-03 DIAGNOSIS — H3320 Serous retinal detachment, unspecified eye: Secondary | ICD-10-CM | POA: Diagnosis not present

## 2018-06-03 DIAGNOSIS — C50911 Malignant neoplasm of unspecified site of right female breast: Secondary | ICD-10-CM | POA: Diagnosis not present

## 2018-06-03 DIAGNOSIS — N390 Urinary tract infection, site not specified: Secondary | ICD-10-CM | POA: Diagnosis not present

## 2018-06-03 DIAGNOSIS — Z0000 Encounter for general adult medical examination without abnormal findings: Secondary | ICD-10-CM | POA: Diagnosis not present

## 2018-09-02 DIAGNOSIS — H4421 Degenerative myopia, right eye: Secondary | ICD-10-CM | POA: Diagnosis not present

## 2018-09-02 DIAGNOSIS — H43391 Other vitreous opacities, right eye: Secondary | ICD-10-CM | POA: Diagnosis not present

## 2018-09-02 DIAGNOSIS — Z8669 Personal history of other diseases of the nervous system and sense organs: Secondary | ICD-10-CM | POA: Diagnosis not present

## 2018-09-02 DIAGNOSIS — H2511 Age-related nuclear cataract, right eye: Secondary | ICD-10-CM | POA: Diagnosis not present

## 2018-09-02 DIAGNOSIS — H31009 Unspecified chorioretinal scars, unspecified eye: Secondary | ICD-10-CM | POA: Diagnosis not present

## 2018-09-02 DIAGNOSIS — H35352 Cystoid macular degeneration, left eye: Secondary | ICD-10-CM | POA: Diagnosis not present

## 2018-09-02 DIAGNOSIS — H43811 Vitreous degeneration, right eye: Secondary | ICD-10-CM | POA: Diagnosis not present

## 2018-09-17 ENCOUNTER — Other Ambulatory Visit: Payer: Self-pay | Admitting: Internal Medicine

## 2018-09-17 DIAGNOSIS — Z1231 Encounter for screening mammogram for malignant neoplasm of breast: Secondary | ICD-10-CM

## 2018-09-30 DIAGNOSIS — L570 Actinic keratosis: Secondary | ICD-10-CM | POA: Diagnosis not present

## 2018-09-30 DIAGNOSIS — L565 Disseminated superficial actinic porokeratosis (DSAP): Secondary | ICD-10-CM | POA: Diagnosis not present

## 2018-09-30 DIAGNOSIS — Z85828 Personal history of other malignant neoplasm of skin: Secondary | ICD-10-CM | POA: Diagnosis not present

## 2018-09-30 DIAGNOSIS — B351 Tinea unguium: Secondary | ICD-10-CM | POA: Diagnosis not present

## 2018-09-30 DIAGNOSIS — L738 Other specified follicular disorders: Secondary | ICD-10-CM | POA: Diagnosis not present

## 2018-09-30 DIAGNOSIS — L821 Other seborrheic keratosis: Secondary | ICD-10-CM | POA: Diagnosis not present

## 2018-10-20 ENCOUNTER — Encounter: Payer: Self-pay | Admitting: Gynecology

## 2018-10-21 ENCOUNTER — Other Ambulatory Visit: Payer: Self-pay

## 2018-10-21 ENCOUNTER — Ambulatory Visit (INDEPENDENT_AMBULATORY_CARE_PROVIDER_SITE_OTHER): Payer: Medicare Other | Admitting: Gynecology

## 2018-10-21 DIAGNOSIS — Z23 Encounter for immunization: Secondary | ICD-10-CM

## 2018-11-03 ENCOUNTER — Ambulatory Visit
Admission: RE | Admit: 2018-11-03 | Discharge: 2018-11-03 | Disposition: A | Discharge status: Home / Self Care | Payer: Medicare Other | Source: Ambulatory Visit | Attending: Internal Medicine | Admitting: Internal Medicine

## 2018-11-03 ENCOUNTER — Other Ambulatory Visit: Payer: Self-pay

## 2018-11-03 DIAGNOSIS — Z1231 Encounter for screening mammogram for malignant neoplasm of breast: Secondary | ICD-10-CM

## 2018-12-03 ENCOUNTER — Encounter: Payer: Self-pay | Admitting: Gynecology

## 2018-12-03 ENCOUNTER — Ambulatory Visit (INDEPENDENT_AMBULATORY_CARE_PROVIDER_SITE_OTHER): Payer: Medicare Other | Admitting: Gynecology

## 2018-12-03 ENCOUNTER — Other Ambulatory Visit: Payer: Self-pay

## 2018-12-03 VITALS — BP 122/78

## 2018-12-03 DIAGNOSIS — N898 Other specified noninflammatory disorders of vagina: Secondary | ICD-10-CM | POA: Diagnosis not present

## 2018-12-03 LAB — WET PREP FOR TRICH, YEAST, CLUE

## 2018-12-03 MED ORDER — TERCONAZOLE 0.4 % VA CREA: 1.0000 | TOPICAL_CREAM | Freq: Every day | VAGINAL | 0 refills | Status: DC

## 2018-12-03 NOTE — Progress Notes (Signed)
    Norma Mayer January 19, 1950 [id number]        68 y.o.  G0P0 presents with a 2-week history of vulvar and perianal itching.  No discharge or odor.  No urinary symptoms.  Past medical history,surgical history, problem list, medications, allergies, family history and social history were all reviewed and documented in the EPIC chart.  Directed ROS with pertinent positives and negatives documented in the history of present illness/assessment and plan.  Exam: Caryn Bee assistant Vitals:   12/03/18 1158  BP: 122/78   General appearance:  Normal Abdomen soft nontender without masses guarding rebound Pelvic external BUS vagina with atrophic changes.  Scant discharge noted.  Mild erythema on both labia majora but no significant rash or concerning changes.  Bimanual without masses or tenderness  Assessment/Plan:  68 y.o. G0P0 with history as above.  Wet prep is negative.  I am going to cover her for a vulvitis secondary to yeast given the short nature of the symptoms and absence of heavier discharge and odor to suggest bacterial.  Terazol 7 day cream nightly.  She will call if her symptoms persist.    Anastasio Auerbach MD, 12:14 PM 12/03/2018

## 2018-12-03 NOTE — Patient Instructions (Signed)
Use the prescribed cream at bedtime.  Follow-up if your symptoms persist.

## 2018-12-13 ENCOUNTER — Telehealth: Payer: Self-pay | Admitting: *Deleted

## 2018-12-13 MED ORDER — FLUCONAZOLE 150 MG PO TABS: 150.0000 mg | ORAL_TABLET | Freq: Every day | ORAL | 0 refills | Status: DC

## 2018-12-13 NOTE — Telephone Encounter (Signed)
Patient called to follow up from [place] on 12/03/18 reports still has external itching after using Terazol cream, feels better but not 100%. She reports last Wednesday she woke up to a rash in groin area, itching and red she used Terazol cream for this area as well, then hydrocortisone cream which seemed to help slightly. Patient said rash is still red and itching still along with external vaginal itching. States you told her to call if no relief and maybe a steroid cream would be sent to pharmacy?  Please advise

## 2018-12-13 NOTE — Telephone Encounter (Signed)
Patient aware, Rx sent.  

## 2018-12-13 NOTE — Telephone Encounter (Signed)
Recommend Diflucan 150 mg daily x5 days.

## 2018-12-14 DIAGNOSIS — I8312 Varicose veins of left lower extremity with inflammation: Secondary | ICD-10-CM | POA: Diagnosis not present

## 2018-12-14 DIAGNOSIS — R21 Rash and other nonspecific skin eruption: Secondary | ICD-10-CM | POA: Diagnosis not present

## 2018-12-14 DIAGNOSIS — L821 Other seborrheic keratosis: Secondary | ICD-10-CM | POA: Diagnosis not present

## 2018-12-14 DIAGNOSIS — L245 Irritant contact dermatitis due to other chemical products: Secondary | ICD-10-CM | POA: Diagnosis not present

## 2018-12-14 DIAGNOSIS — I872 Venous insufficiency (chronic) (peripheral): Secondary | ICD-10-CM | POA: Diagnosis not present

## 2018-12-14 DIAGNOSIS — Z85828 Personal history of other malignant neoplasm of skin: Secondary | ICD-10-CM | POA: Diagnosis not present

## 2018-12-14 DIAGNOSIS — R233 Spontaneous ecchymoses: Secondary | ICD-10-CM | POA: Diagnosis not present

## 2018-12-14 DIAGNOSIS — I8311 Varicose veins of right lower extremity with inflammation: Secondary | ICD-10-CM | POA: Diagnosis not present

## 2018-12-14 DIAGNOSIS — L570 Actinic keratosis: Secondary | ICD-10-CM | POA: Diagnosis not present

## 2019-01-04 ENCOUNTER — Telehealth: Payer: Self-pay | Admitting: *Deleted

## 2019-01-04 MED ORDER — TRIAMCINOLONE ACETONIDE 0.1 % EX CREA: 1.0000 "application " | TOPICAL_CREAM | Freq: Two times a day (BID) | CUTANEOUS | 0 refills | Status: DC

## 2019-01-04 NOTE — Telephone Encounter (Addendum)
Patient called to follow up from [place] 12/03/18 completed Terazol 7 day cream. Patient said the external itching has returned, patient said the cream didn't help much. Asked if another cream could be sent? Patient thought maybe you sent clobetasol cream years ago? Patient has Covid and not able to come in for office visit. Please advise

## 2019-01-04 NOTE — Telephone Encounter (Signed)
Okay for triamcinolone 0.1% cream 30 g apply twice daily as needed

## 2019-01-04 NOTE — Telephone Encounter (Signed)
Patient aware, Rx sent.  

## 2019-01-26 DIAGNOSIS — Z85828 Personal history of other malignant neoplasm of skin: Secondary | ICD-10-CM | POA: Diagnosis not present

## 2019-01-26 DIAGNOSIS — R21 Rash and other nonspecific skin eruption: Secondary | ICD-10-CM | POA: Diagnosis not present

## 2019-03-28 ENCOUNTER — Other Ambulatory Visit: Payer: Self-pay

## 2019-03-29 ENCOUNTER — Encounter: Payer: Self-pay | Admitting: Obstetrics and Gynecology

## 2019-03-29 ENCOUNTER — Ambulatory Visit (INDEPENDENT_AMBULATORY_CARE_PROVIDER_SITE_OTHER): Payer: Medicare HMO | Admitting: Obstetrics and Gynecology

## 2019-03-29 VITALS — BP 124/76 | Ht 62.2 in | Wt 164.0 lb

## 2019-03-29 DIAGNOSIS — Z1272 Encounter for screening for malignant neoplasm of vagina: Secondary | ICD-10-CM

## 2019-03-29 DIAGNOSIS — Z01419 Encounter for gynecological examination (general) (routine) without abnormal findings: Secondary | ICD-10-CM

## 2019-03-29 DIAGNOSIS — M810 Age-related osteoporosis without current pathological fracture: Secondary | ICD-10-CM

## 2019-03-29 DIAGNOSIS — Z853 Personal history of malignant neoplasm of breast: Secondary | ICD-10-CM

## 2019-03-29 DIAGNOSIS — Z780 Asymptomatic menopausal state: Secondary | ICD-10-CM

## 2019-03-29 DIAGNOSIS — Z9289 Personal history of other medical treatment: Secondary | ICD-10-CM

## 2019-03-29 NOTE — Patient Instructions (Signed)
I encourage you to get 30 minutes of regular weight bearing exercise (such as walking) each day at least 5 days per week. I recommend 1200 mg of calcium daily from a combination of dietary and supplement sources I recommend 1000-2000 IU of vitamin D per day We will plan to recheck the DEXA bone density scan in this year, please remember to schedule this.

## 2019-03-29 NOTE — Addendum Note (Signed)
Addended by: Nelva Nay on: 03/29/2019 12:01 PM   Modules accepted: Orders

## 2019-03-29 NOTE — Progress Notes (Signed)
Norma Mayer 01-Mar-1950 [id number]  SUBJECTIVE:  69 y.o. G0P0 female for annual routine gynecologic exam and Pap smear. Occasionally has some itching on over area which is well controlled by sparing and occasional use of triamcinolone cream.  Denies any vaginal discharge or vaginal bleeding.  She did recover from Covid after contracting it this past December.    Current Outpatient Medications  Medication Sig Dispense Refill  . calcium-vitamin D (OSCAL WITH D) 500-200 MG-UNIT per tablet Take 1 tablet by mouth daily.    . Cholecalciferol (VITAMIN D PO) Take by mouth.    . Travoprost, BAK Free, (TRAVATAN) 0.004 % SOLN ophthalmic solution 1 drop at bedtime.    . triamcinolone cream (KENALOG) 0.1 % Apply 1 application topically 2 (two) times daily. 30 g 0   No current facility-administered medications for this visit.   Allergies: Ketoconazole, Other, and Belladonna alkaloids  No LMP recorded. Patient has had a hysterectomy.  Past medical history,surgical history, problem list, medications, allergies, family history and social history were all reviewed and documented as reviewed in the EPIC chart.  ROS:  Feeling well. No dyspnea or chest pain on exertion.  No abdominal pain, change in bowel habits, black or bloody stools.  No urinary tract symptoms. GYN ROS: normal menses, no abnormal bleeding, pelvic pain or discharge, no breast pain or new or enlarging lumps on self exam. No neurological complaints.    OBJECTIVE:  BP 124/76   Ht 5' 2.2" (1.58 m)   Wt 164 lb (74.4 kg)   BMI 29.80 kg/m  The patient appears well, alert, oriented x 3, in no distress. ENT normal.  Neck supple. No cervical or supraclavicular adenopathy or thyromegaly.  Lungs are clear, good air entry, no wheezes, rhonchi or rales. S1 and S2 normal, no murmurs, regular rate and rhythm.  Abdomen soft without tenderness, guarding, mass or organomegaly.  Neurological is normal, no focal findings.  BREAST EXAM:  breasts appear normal, no suspicious masses, no skin or nipple changes or axillary nodes  PELVIC EXAM: VULVA: normal appearing vulva with no masses, tenderness or lesions, VAGINA: normal appearing vagina with normal color and discharge, no lesions, CERVIX: surgically absent, UTERUS: surgically absent, vaginal cuff normal, ADNEXA: no masses, non tender, PAP: Pap smear done today, thin-prep method  Chaperone: Caryn Bee present during the examination  ASSESSMENT:  69 y.o. G0P0 here for annual gynecologic exam  PLAN:   1.  Menopausal.  No significant menopausal symptoms.  Prior TAH/BSO in the past.   2. Vaginal itching.  No abnormal findings on exam today.  Nothing to suggest candidiasis or dermatosis at this time.  She can continue use of triamcinolone as she uses it infrequently.  She indicates she does not need a refill at this time. 3. Pap smear 2019.  No significant history of abnormal Pap smears.  She does request a Pap smear today and would like to continue screening at this time.  Smear collected from vaginal cuff. 4. Mammogram 10/2018.  Normal breast exam today.  She is reminded to schedule an annual mammogram this year when due. 5. Colonoscopy 2014.  Recommended that she follow up at the recommended interval.   6. Osteoporosis at wrist.  DEXA 2019.  Next DEXA recommended this year.  So she plans to schedule this.  Discussed dangers of untreated osteoporosis including fracture risks.  Still getting back to doing things to take care of herself after spending a long time take care of her mother for she passed.  7. Health maintenance.  No labs today as she normally has these completed with her primary care provider.    Return annually or sooner, prn.  Joseph Pierini MD  03/29/19

## 2019-03-30 DIAGNOSIS — Z20828 Contact with and (suspected) exposure to other viral communicable diseases: Secondary | ICD-10-CM | POA: Diagnosis not present

## 2019-03-30 NOTE — Addendum Note (Signed)
Addended by: Nelva Nay on: 03/30/2019 12:12 PM   Modules accepted: Orders

## 2019-04-01 LAB — PAP IG W/ RFLX HPV ASCU

## 2019-05-24 DIAGNOSIS — H2511 Age-related nuclear cataract, right eye: Secondary | ICD-10-CM | POA: Diagnosis not present

## 2019-05-24 DIAGNOSIS — H4032X4 Glaucoma secondary to eye trauma, left eye, indeterminate stage: Secondary | ICD-10-CM | POA: Diagnosis not present

## 2019-05-24 DIAGNOSIS — H52201 Unspecified astigmatism, right eye: Secondary | ICD-10-CM | POA: Diagnosis not present

## 2019-05-31 DIAGNOSIS — R69 Illness, unspecified: Secondary | ICD-10-CM | POA: Diagnosis not present

## 2019-06-07 DIAGNOSIS — M810 Age-related osteoporosis without current pathological fracture: Secondary | ICD-10-CM | POA: Diagnosis not present

## 2019-06-07 DIAGNOSIS — R7989 Other specified abnormal findings of blood chemistry: Secondary | ICD-10-CM | POA: Diagnosis not present

## 2019-06-07 DIAGNOSIS — Z0000 Encounter for general adult medical examination without abnormal findings: Secondary | ICD-10-CM | POA: Diagnosis not present

## 2019-06-10 ENCOUNTER — Other Ambulatory Visit: Payer: Self-pay

## 2019-06-10 DIAGNOSIS — R82998 Other abnormal findings in urine: Secondary | ICD-10-CM | POA: Diagnosis not present

## 2019-06-14 ENCOUNTER — Other Ambulatory Visit: Payer: Self-pay | Admitting: Obstetrics and Gynecology

## 2019-06-14 ENCOUNTER — Ambulatory Visit (INDEPENDENT_AMBULATORY_CARE_PROVIDER_SITE_OTHER): Payer: Medicare HMO

## 2019-06-14 ENCOUNTER — Other Ambulatory Visit: Payer: Self-pay

## 2019-06-14 DIAGNOSIS — H3320 Serous retinal detachment, unspecified eye: Secondary | ICD-10-CM | POA: Diagnosis not present

## 2019-06-14 DIAGNOSIS — Z0000 Encounter for general adult medical examination without abnormal findings: Secondary | ICD-10-CM | POA: Diagnosis not present

## 2019-06-14 DIAGNOSIS — Z780 Asymptomatic menopausal state: Secondary | ICD-10-CM

## 2019-06-14 DIAGNOSIS — M79673 Pain in unspecified foot: Secondary | ICD-10-CM | POA: Diagnosis not present

## 2019-06-14 DIAGNOSIS — R69 Illness, unspecified: Secondary | ICD-10-CM | POA: Diagnosis not present

## 2019-06-14 DIAGNOSIS — Z1331 Encounter for screening for depression: Secondary | ICD-10-CM | POA: Diagnosis not present

## 2019-06-14 DIAGNOSIS — C50911 Malignant neoplasm of unspecified site of right female breast: Secondary | ICD-10-CM | POA: Diagnosis not present

## 2019-06-14 DIAGNOSIS — H6123 Impacted cerumen, bilateral: Secondary | ICD-10-CM | POA: Diagnosis not present

## 2019-06-14 DIAGNOSIS — J309 Allergic rhinitis, unspecified: Secondary | ICD-10-CM | POA: Diagnosis not present

## 2019-06-14 DIAGNOSIS — L259 Unspecified contact dermatitis, unspecified cause: Secondary | ICD-10-CM | POA: Diagnosis not present

## 2019-06-14 DIAGNOSIS — M810 Age-related osteoporosis without current pathological fracture: Secondary | ICD-10-CM

## 2019-06-16 DIAGNOSIS — Z1212 Encounter for screening for malignant neoplasm of rectum: Secondary | ICD-10-CM | POA: Diagnosis not present

## 2019-06-21 DIAGNOSIS — Z0131 Encounter for examination of blood pressure with abnormal findings: Secondary | ICD-10-CM | POA: Diagnosis not present

## 2019-06-21 DIAGNOSIS — D179 Benign lipomatous neoplasm, unspecified: Secondary | ICD-10-CM | POA: Diagnosis not present

## 2019-08-12 ENCOUNTER — Ambulatory Visit: Payer: Medicare HMO | Admitting: Obstetrics and Gynecology

## 2019-08-12 ENCOUNTER — Encounter: Payer: Self-pay | Admitting: Obstetrics and Gynecology

## 2019-08-12 ENCOUNTER — Other Ambulatory Visit: Payer: Self-pay

## 2019-08-12 VITALS — BP 124/80

## 2019-08-12 DIAGNOSIS — M810 Age-related osteoporosis without current pathological fracture: Secondary | ICD-10-CM | POA: Diagnosis not present

## 2019-08-12 NOTE — Progress Notes (Signed)
   Norma Mayer Jul 19, 1950 [id number]  SUBJECTIVE:  70 y.o. G0P0 female presents for discussion of her most recent DEXA result from 06/14/2019.  Her last DEXA 03/2017 had indicated a T score of -2.5 in the distal third of the radius.  Her spine is not measured due to scoliosis and degenerative changes.  She had a decided at time of last DEXA to try to be more aggressive with her vitamin D intake and now she is up to 10,000 IU/day, with levels being followed by her primary doctor and she says that her levels were normal.  She admits to not being as regular about calcium intake as she should be.   Current Outpatient Medications  Medication Sig Dispense Refill  . calcium-vitamin D (OSCAL WITH D) 500-200 MG-UNIT per tablet Take 1 tablet by mouth daily.    . Cholecalciferol (VITAMIN D PO) Take by mouth.    . Travoprost, BAK Free, (TRAVATAN) 0.004 % SOLN ophthalmic solution 1 drop at bedtime.    . triamcinolone cream (KENALOG) 0.1 % Apply 1 application topically 2 (two) times daily. 30 g 0   No current facility-administered medications for this visit.   Allergies: Ketoconazole, Other, and Belladonna alkaloids  No LMP recorded. Patient has had a hysterectomy.  Past medical history,surgical history, problem list, medications, allergies, family history and social history were all reviewed and documented as reviewed in the EPIC chart.   OBJECTIVE:  BP 124/80  The patient appears well, alert, oriented x 3, in no distress. Exam deferred due to consultative nature of visit   ASSESSMENT:  69 y.o. G0P0 with osteoporosis of the radius here for discussion of DEXA results  PLAN:  We had an extensive discussion about diagnosis of osteoporosis and risk for fracture particularly in her forearm.  Her femoral measurements are still not that bad but did show some loss of BMD in the right hip, and the T score there is -1.2.  Her FRAX score shows a low 10-year predicted fracture risk.  Again we discussed  the issue as to whether she should treat with medication based on the results from the distal third of the radius with maintenance of relatively good femoral measurements, also noting the unknown condition of her spine BMD due to scoliosis and degenerative changes.  I encouraged her to think more seriously about initiating bisphosphonate treatment, or Prolia, given the loss of BMD.  However we both agree that basing decision to treat on the forearm/radius data is not absolutely necessary and she would rather continue to try to work on some light and careful weightbearing/resistance training in her upper extremities as recommended also by her primary physician, while also trying to be more regular about her calcium intake.  As mentioned above, she reports her vitamin D levels have improved and I did tell her that she should consider getting interval checks of the vitamin D level over 3 to 6 months since she is on a higher dose.  We will recheck her DEXA scan in 1 year and go from there.  All questions were answered by the end of the visit.  Joseph Pierini MD 08/12/19

## 2019-09-05 ENCOUNTER — Ambulatory Visit (INDEPENDENT_AMBULATORY_CARE_PROVIDER_SITE_OTHER): Payer: Medicare HMO | Admitting: Ophthalmology

## 2019-09-05 ENCOUNTER — Other Ambulatory Visit: Payer: Self-pay

## 2019-09-05 ENCOUNTER — Encounter (INDEPENDENT_AMBULATORY_CARE_PROVIDER_SITE_OTHER): Payer: Self-pay | Admitting: Ophthalmology

## 2019-09-05 DIAGNOSIS — H401131 Primary open-angle glaucoma, bilateral, mild stage: Secondary | ICD-10-CM | POA: Insufficient documentation

## 2019-09-05 DIAGNOSIS — H2511 Age-related nuclear cataract, right eye: Secondary | ICD-10-CM | POA: Diagnosis not present

## 2019-09-05 DIAGNOSIS — H43811 Vitreous degeneration, right eye: Secondary | ICD-10-CM

## 2019-09-05 DIAGNOSIS — Z961 Presence of intraocular lens: Secondary | ICD-10-CM | POA: Insufficient documentation

## 2019-09-05 DIAGNOSIS — Z8669 Personal history of other diseases of the nervous system and sense organs: Secondary | ICD-10-CM | POA: Insufficient documentation

## 2019-09-05 NOTE — Assessment & Plan Note (Signed)

## 2019-09-05 NOTE — Assessment & Plan Note (Addendum)
The nature of cataract was discussed with the patient as well as the elective nature of surgery. The patient was reassured that surgery at a later date does not put the patient at risk for a worse outcome. It was emphasized that the need for surgery is dictated by the patient's quality of life as influenced by the cataract. Patient was instructed to maintain close follow up with their general eye care doctor. Currently with good vision in the right eye, no interval changes and no nighttime difficulty with driving I explained that low light situations will be her earliest symptom of significant cataract OD  Follow-up with Dr. Ellie Lunch as scheduled

## 2019-09-05 NOTE — Assessment & Plan Note (Signed)
Dr. Luberta Mutter as scheduled, intraocular pressure today moderately elevated yet with Tono-Pen screening test

## 2019-09-05 NOTE — Progress Notes (Signed)
09/05/2019     CHIEF COMPLAINT Patient presents for Retina Follow Up   HISTORY OF PRESENT ILLNESS: Norma Mayer is a 69 y.o. female who presents to the clinic today for:   HPI    Retina Follow Up    Patient presents with  Other.  In both eyes.  This started 1 year ago.  Severity is mild.  Duration of 1 year.  Since onset it is stable.          Comments    1 Year F/U OU  Pt denies noticeable changes to [place] OU. Pt reports continued intermittent floaters OU. No new symptoms reported OU.       Last edited by Rockie Neighbours, [place] on 09/05/2019  2:02 PM. (History)      Referring physician: Prince Solian, MD [place],  [place] [address]  HISTORICAL INFORMATION:   Selected notes from the MEDICAL RECORD NUMBER       CURRENT MEDICATIONS: Current Outpatient Medications (Ophthalmic Drugs)  Medication Sig  . Travoprost, BAK Free, (TRAVATAN) 0.004 % SOLN ophthalmic solution 1 drop at bedtime.   No current facility-administered medications for this visit. (Ophthalmic Drugs)   Current Outpatient Medications (Other)  Medication Sig  . calcium-vitamin D (OSCAL WITH D) 500-200 MG-UNIT per tablet Take 1 tablet by mouth daily.  . Cholecalciferol (VITAMIN D PO) Take by mouth.  . triamcinolone cream (KENALOG) 0.1 % Apply 1 application topically 2 (two) times daily.   No current facility-administered medications for this visit. (Other)      REVIEW OF SYSTEMS:    ALLERGIES Allergies  Allergen Reactions  . Combigan [Brimonidine Tartrate-Timolol]   . Dorzolamide   . Ketoconazole Nausea And Vomiting    headache  . Mydriacyl [Tropicamide]   . Other Itching    ADHESIVE TAPE:redness  . Timolol   . Belladonna Alkaloids Palpitations    headache    PAST MEDICAL HISTORY Past Medical History:  Diagnosis Date  . Cancer ([place]) 2006   BREAST CANCER/infiltrating ductal, ER/PR positive  . Osteopenia 03/2015   T score -2.4 FRAX 7.5%/0.5%  stable from prior  DEXA  . Retinal detachment    Past Surgical History:  Procedure Laterality Date  . ABDOMINAL HYSTERECTOMY  2003   TAH,BSO mucinous cystadenoma/leiomyoma/endosalpingosis  . BREAST BIOPSY  08/1999   X 2 BREAST BX--ATYPICAL DUCTAL HYPERPLASIA  . BREAST LUMPECTOMY  2006   BREAST CANCER 2006  . EYE SURGERY  2009/2010   RETINA EYE SURGERY  . OOPHORECTOMY     BSO  . RETINAL DETACHMENT SURGERY  2009-2010  . TONSILLECTOMY  1958    FAMILY HISTORY Family History  Problem Relation Age of Onset  . Hypertension Father   . Cancer Mother        UTERINE  . Heart disease Mother     SOCIAL HISTORY Social History   Tobacco Use  . Smoking status: Former Research scientist (life sciences)  . Smokeless tobacco: Never Used  Vaping Use  . Vaping Use: Never used  Substance Use Topics  . Alcohol use: Yes    Alcohol/week: 0.0 standard drinks    Comment: MODERATE  . Drug use: No         OPHTHALMIC EXAM:  Base Eye Exam    Visual Acuity (ETDRS)      Right Left   Dist cc 20/20 20/200 +1   Dist ph cc  NI   Correction: Glasses       Tonometry (Tonopen, 2:06 PM)  Right Left   Pressure 10 24       Pupils      Dark Light Shape React APD   Right 4 3 Round Brisk None   Left 7 7 Irregular Fixed None       Visual Fields (Counting fingers)      Left Right     Full   Restrictions Total superior nasal deficiency        Extraocular Movement      Right Left    Full Full       Neuro/Psych    Oriented x3: Yes   Mood/Affect: Normal       Dilation    Both eyes: 2.5% Phenylephrine @ 2:09 PM        Slit Lamp and Fundus Exam    External Exam      Right Left   External Normal Normal       Slit Lamp Exam      Right Left   Lids/Lashes Normal Normal   Conjunctiva/Sclera White and quiet White and quiet   Cornea Clear Clear   Anterior Chamber Deep and quiet Deep and quiet   Iris Round and reactive Round and reactive   Lens 2+ Nuclear sclerosis Centered posterior chamber intraocular lens   Anterior  Vitreous Normal Normal       Fundus Exam      Right Left   Posterior Vitreous Posterior vitreous detachment Clear, vitrectomized   Disc Normal Normal   C/D Ratio 0.6 0.65   Macula Normal Normal   Vessels Normal Normal   Periphery No peripheral retinal holes or tears Retinopexy, retina attached, no new retinal breaks          IMAGING AND PROCEDURES  Imaging and Procedures for 09/05/19  Color Fundus Photography Optos - OU - Both Eyes       Right Eye Progression has improved. Disc findings include normal observations. Macula : normal observations. Vessels : normal observations. Periphery : normal observations.   Left Eye Progression has improved. Disc findings include normal observations. Macula : normal observations. Vessels : normal observations.   Notes OS, status post retinal detachment repair left eye,  No new holes or tears.,  Open angle glaucoma, mild stage follow-up with Dr. Ellie Lunch as scheduled                  ASSESSMENT/PLAN:  Posterior vitreous detachment of right eye   The nature of posterior vitreous detachment was discussed with the patient as well as its physiology, its age prevalence, and its possible implication regarding retinal breaks and detachment.  An informational brochure was given to the patient.  All the patient's questions were answered.  The patient was asked to return if new or different flashes or floaters develops.   Patient was instructed to contact office immediately if any changes were noticed. I explained to the patient that vitreous inside the eye is similar to jello inside a bowl. As the jello melts it can start to pull away from the bowl, similarly the vitreous throughout our lives can begin to pull away from the retina. That process is called a posterior vitreous detachment. In some cases, the vitreous can tug hard enough on the retina to form a retinal tear. I discussed with the patient the signs and symptoms of a retinal  detachment.  Do not rub the eye.  Nuclear sclerotic cataract of right eye The nature of cataract was discussed with the patient as well as the  elective nature of surgery. The patient was reassured that surgery at a later date does not put the patient at risk for a worse outcome. It was emphasized that the need for surgery is dictated by the patient's quality of life as influenced by the cataract. Patient was instructed to maintain close follow up with their general eye care doctor. Currently with good vision in the right eye, no interval changes and no nighttime difficulty with driving I explained that low light situations will be her earliest symptom of significant cataract OD  Follow-up with Dr. Ellie Lunch as scheduled  Primary open angle glaucoma of both eyes, mild stage Dr. Luberta Mutter as scheduled, intraocular pressure today moderately elevated yet with Tono-Pen screening test      ICD-10-CM   1. Posterior vitreous detachment of right eye  H43.811 Color Fundus Photography Optos - OU - Both Eyes  2. Pseudophakia  Z96.1   3. History of retinal detachment  Z86.69 Color Fundus Photography Optos - OU - Both Eyes  4. Nuclear sclerotic cataract of right eye  H25.11   5. Primary open angle glaucoma of both eyes, mild stage  H40.1131 Color Fundus Photography Optos - OU - Both Eyes    1.  2.  3.  Ophthalmic Meds Ordered this visit:  No orders of the defined types were placed in this encounter.      Return in about 1 year (around 09/04/2020) for DILATE OU, OCT.  Patient Instructions  Patient to notify the office promptly if new visual acuity decline or new symptoms develop    Explained the diagnoses, plan, and follow up with the patient and they expressed understanding.  Patient expressed understanding of the importance of proper follow up care.   Clent Demark Deolinda Frid M.D. Diseases & Surgery of the Retina and Vitreous [facility] 09/05/19     Abbreviations: M  myopia (nearsighted); A astigmatism; H hyperopia (farsighted); P presbyopia; Mrx spectacle prescription;  CTL contact lenses; OD right eye; OS left eye; OU both eyes  XT exotropia; ET esotropia; PEK punctate epithelial keratitis; PEE punctate epithelial erosions; DES dry eye syndrome; MGD meibomian gland dysfunction; ATs artificial tears; PFAT's preservative free artificial tears; [place] nuclear sclerotic cataract; PSC posterior subcapsular cataract; ERM epi-retinal membrane; PVD posterior vitreous detachment; RD retinal detachment; DM diabetes mellitus; DR diabetic retinopathy; NPDR non-proliferative diabetic retinopathy; PDR proliferative diabetic retinopathy; CSME clinically significant macular edema; DME diabetic macular edema; dbh dot blot hemorrhages; CWS cotton wool spot; POAG primary open angle glaucoma; C/D cup-to-disc ratio; HVF humphrey visual field; GVF goldmann visual field; OCT optical coherence tomography; IOP intraocular pressure; BRVO Branch retinal vein occlusion; CRVO central retinal vein occlusion; CRAO central retinal artery occlusion; BRAO branch retinal artery occlusion; RT retinal tear; SB scleral buckle; PPV pars plana vitrectomy; VH Vitreous hemorrhage; PRP panretinal laser photocoagulation; IVK intravitreal kenalog; VMT vitreomacular traction; MH Macular hole;  NVD neovascularization of the disc; NVE neovascularization elsewhere; AREDS age related eye disease study; ARMD age related macular degeneration; POAG primary open angle glaucoma; EBMD epithelial/anterior basement membrane dystrophy; ACIOL anterior chamber intraocular lens; IOL intraocular lens; PCIOL posterior chamber intraocular lens; Phaco/IOL phacoemulsification with intraocular lens placement; [place] photorefractive keratectomy; LASIK laser assisted in situ keratomileusis; HTN hypertension; DM diabetes mellitus; COPD chronic obstructive pulmonary disease

## 2019-09-05 NOTE — Patient Instructions (Signed)
Patient to notify the office promptly if new visual acuity decline or new symptoms develop

## 2019-09-20 ENCOUNTER — Telehealth: Payer: Self-pay

## 2019-09-20 MED ORDER — TRIAMCINOLONE ACETONIDE 0.1 % EX CREA: 1.0000 "application " | TOPICAL_CREAM | Freq: Two times a day (BID) | CUTANEOUS | 1 refills | Status: DC

## 2019-09-20 NOTE — Telephone Encounter (Signed)
Patient called requesting Triamcinolone Cream Rx be sent with refill.    03/29/19 office visit Dr. Allena Napoleon wrote "2. Vaginal itching.  No abnormal findings on exam today.  Nothing to suggest candidiasis or dermatosis at this time.  She can continue use of triamcinolone as she uses it infrequently.  She indicates she does not need a refill at this time."  Refill sent.

## 2019-09-21 DIAGNOSIS — H4032X4 Glaucoma secondary to eye trauma, left eye, indeterminate stage: Secondary | ICD-10-CM | POA: Diagnosis not present

## 2019-09-29 DIAGNOSIS — Z85828 Personal history of other malignant neoplasm of skin: Secondary | ICD-10-CM | POA: Diagnosis not present

## 2019-09-29 DIAGNOSIS — L309 Dermatitis, unspecified: Secondary | ICD-10-CM | POA: Diagnosis not present

## 2019-09-29 DIAGNOSIS — L821 Other seborrheic keratosis: Secondary | ICD-10-CM | POA: Diagnosis not present

## 2019-10-03 ENCOUNTER — Other Ambulatory Visit: Payer: Self-pay | Admitting: Internal Medicine

## 2019-10-03 DIAGNOSIS — Z1231 Encounter for screening mammogram for malignant neoplasm of breast: Secondary | ICD-10-CM

## 2019-10-12 ENCOUNTER — Telehealth: Payer: Self-pay

## 2019-10-12 NOTE — Telephone Encounter (Signed)
Patient called in voice mail stating she thought she had called here recently for a refill on her generic Kenalog cream but she cannot find it and wonders now if she called. I called her back and told her that she did call on 9/7 and Rx with refill was sent.  She is not sure if she picked it up or not now. I told her to check with her pharmacy and if she did not get it then they will need to fill it for her because they likely will have returned it to the shelf.  If she did pick it up she will need to call her ins co for an override if she has misplaced it and needs to get it again.

## 2019-11-07 ENCOUNTER — Other Ambulatory Visit: Payer: Self-pay

## 2019-11-07 ENCOUNTER — Ambulatory Visit
Admission: RE | Admit: 2019-11-07 | Discharge: 2019-11-07 | Disposition: A | Discharge status: Home / Self Care | Payer: Medicare HMO | Source: Ambulatory Visit | Attending: Internal Medicine | Admitting: Internal Medicine

## 2019-11-07 DIAGNOSIS — Z1231 Encounter for screening mammogram for malignant neoplasm of breast: Secondary | ICD-10-CM

## 2019-11-09 DIAGNOSIS — R69 Illness, unspecified: Secondary | ICD-10-CM | POA: Diagnosis not present

## 2019-12-02 DIAGNOSIS — Z20822 Contact with and (suspected) exposure to covid-19: Secondary | ICD-10-CM | POA: Diagnosis not present

## 2019-12-13 DIAGNOSIS — M25561 Pain in right knee: Secondary | ICD-10-CM | POA: Diagnosis not present

## 2019-12-13 DIAGNOSIS — M25562 Pain in left knee: Secondary | ICD-10-CM | POA: Diagnosis not present

## 2020-01-19 ENCOUNTER — Other Ambulatory Visit: Payer: Self-pay

## 2020-01-19 ENCOUNTER — Ambulatory Visit (INDEPENDENT_AMBULATORY_CARE_PROVIDER_SITE_OTHER): Payer: Medicare HMO | Admitting: *Deleted

## 2020-01-19 DIAGNOSIS — Z23 Encounter for immunization: Secondary | ICD-10-CM

## 2020-02-08 DIAGNOSIS — H4032X4 Glaucoma secondary to eye trauma, left eye, indeterminate stage: Secondary | ICD-10-CM | POA: Diagnosis not present

## 2020-04-23 ENCOUNTER — Ambulatory Visit: Payer: Medicare HMO | Admitting: Obstetrics & Gynecology

## 2020-04-26 ENCOUNTER — Other Ambulatory Visit: Payer: Self-pay

## 2020-04-26 ENCOUNTER — Encounter: Payer: Self-pay | Admitting: Obstetrics & Gynecology

## 2020-04-26 ENCOUNTER — Ambulatory Visit: Payer: Medicare HMO | Admitting: Obstetrics & Gynecology

## 2020-04-26 VITALS — BP 140/86 | Ht 62.0 in | Wt 169.0 lb

## 2020-04-26 DIAGNOSIS — M810 Age-related osteoporosis without current pathological fracture: Secondary | ICD-10-CM | POA: Diagnosis not present

## 2020-04-26 DIAGNOSIS — Z01419 Encounter for gynecological examination (general) (routine) without abnormal findings: Secondary | ICD-10-CM

## 2020-04-26 DIAGNOSIS — Z780 Asymptomatic menopausal state: Secondary | ICD-10-CM | POA: Diagnosis not present

## 2020-04-26 DIAGNOSIS — N904 Leukoplakia of vulva: Secondary | ICD-10-CM | POA: Diagnosis not present

## 2020-04-26 DIAGNOSIS — Z90710 Acquired absence of both cervix and uterus: Secondary | ICD-10-CM | POA: Diagnosis not present

## 2020-04-26 MED ORDER — CLOBETASOL PROPIONATE 0.05 % EX CREA: 1.0000 "application " | TOPICAL_CREAM | Freq: Every day | CUTANEOUS | 4 refills | Status: AC

## 2020-04-26 NOTE — Progress Notes (Signed)
Norma Mayer 10-Apr-1950 [id number]   History:    70 y.o. G0 Divorced.  RP: Established patient presenting for annual gyn exam   HPI: S/P Total Hysterectomy.  Postmenopause, well on no HRT.  No pelvic pain.  Abstinent.  Vulvar and perianal irritation for which the triamcinolone cream helps for a while.  H/O Rt Breast Ca.  Breasts normal.  Urine/BMs normal.  BMI 30.91.  Health labs with Fam MD.  Past medical history,surgical history, family history and social history were all reviewed and documented in the EPIC chart.  Gynecologic History No LMP recorded. Patient has had a hysterectomy.  Obstetric History OB History  Gravida Para Term Preterm AB Living  0            SAB IAB Ectopic Multiple Live Births                ROS: A ROS was performed and pertinent positives and negatives are included in the history.  GENERAL: No fevers or chills. HEENT: No change in vision, no earache, sore throat or sinus congestion. NECK: No pain or stiffness. CARDIOVASCULAR: No chest pain or pressure. No palpitations. PULMONARY: No shortness of breath, cough or wheeze. GASTROINTESTINAL: No abdominal pain, nausea, vomiting or diarrhea, melena or bright red blood per rectum. GENITOURINARY: No urinary frequency, urgency, hesitancy or dysuria. MUSCULOSKELETAL: No joint or muscle pain, no back pain, no recent trauma. DERMATOLOGIC: No rash, no itching, no lesions. ENDOCRINE: No polyuria, polydipsia, no heat or cold intolerance. No recent change in weight. HEMATOLOGICAL: No anemia or easy bruising or bleeding. NEUROLOGIC: No headache, seizures, numbness, tingling or weakness. PSYCHIATRIC: No depression, no loss of interest in normal activity or change in sleep pattern.     Exam:   BP 140/86   Ht 5\' 2"  (1.575 m)   Wt 169 lb (76.7 kg)   BMI 30.91 kg/m   Body mass index is 30.91 kg/m.  General appearance : Well developed well nourished female. No acute distress HEENT: Eyes: no retinal hemorrhage  or exudates,  Neck supple, trachea midline, no carotid bruits, no thyroidmegaly Lungs: Clear to auscultation, no rhonchi or wheezes, or rib retractions  Heart: Regular rate and rhythm, no murmurs or gallops Breast:Examined in sitting and supine position were symmetrical in appearance, no palpable masses or tenderness,  no skin retraction, no nipple inversion, no nipple discharge, no skin discoloration, no axillary or supraclavicular lymphadenopathy Abdomen: no palpable masses or tenderness, no rebound or guarding Extremities: no edema or skin discoloration or tenderness  Pelvic: Vulva: Normal             Vagina: No gross lesions or discharge  Cervix/Uterus absent  Adnexa  Without masses or tenderness  Anus: Normal   Assessment/Plan:  70 y.o. female for annual exam   1. Well female exam with routine gynecological exam Gynecologic exam s/p Total Hysterectomy.  No indication for a Pap test at this time, Pap neg 03/2019.  Breast exam normal s/p Rt lumpectomy for Rt breast Ca.  Screening mammo 10/2019 Neg.  [place] 2014.  Health labs with Fam MD.  BMI 30.91.  Recommend a mild decrease in calories/carbs.  Aerobic activities 5 times a week and light weight lifting every 2 days.  2. S/P total hysterectomy  3. Postmenopause Well on no HRT.  4. Postmenopausal osteoporosis Osteoporosis BD 06/2019 T-Score -2.8 at left Forearm.  Decision not to start on Bone Medication.  Increased Vit D/Ca++ and weight bearing physical activities.  Will repeat a  BD 09/5070.  5. Lichen sclerosus et atrophicus of the vulva Counseling on Lichen Sclerosus done.  Clobetasol 0.05% cream daily x 14 days, then twice a week long term.  Usage reviewed and prescription sent to pharmacy.  Other orders - clobetasol cream (TEMOVATE) 0.05 %; Apply 1 application topically daily. Apply a thin layer on the vulva and perianal area daily x 2 weeks, then twice a week.  Princess Bruins MD, 10:32 AM 04/26/2020

## 2020-05-30 DIAGNOSIS — H4032X4 Glaucoma secondary to eye trauma, left eye, indeterminate stage: Secondary | ICD-10-CM | POA: Diagnosis not present

## 2020-05-30 DIAGNOSIS — H5213 Myopia, bilateral: Secondary | ICD-10-CM | POA: Diagnosis not present

## 2020-05-30 DIAGNOSIS — H25041 Posterior subcapsular polar age-related cataract, right eye: Secondary | ICD-10-CM | POA: Diagnosis not present

## 2020-07-02 DIAGNOSIS — H5712 Ocular pain, left eye: Secondary | ICD-10-CM | POA: Diagnosis not present

## 2020-07-02 DIAGNOSIS — H01005 Unspecified blepharitis left lower eyelid: Secondary | ICD-10-CM | POA: Diagnosis not present

## 2020-07-17 DIAGNOSIS — Z0000 Encounter for general adult medical examination without abnormal findings: Secondary | ICD-10-CM | POA: Diagnosis not present

## 2020-07-17 DIAGNOSIS — R7989 Other specified abnormal findings of blood chemistry: Secondary | ICD-10-CM | POA: Diagnosis not present

## 2020-07-17 DIAGNOSIS — M810 Age-related osteoporosis without current pathological fracture: Secondary | ICD-10-CM | POA: Diagnosis not present

## 2020-07-18 DIAGNOSIS — M25561 Pain in right knee: Secondary | ICD-10-CM | POA: Diagnosis not present

## 2020-07-18 DIAGNOSIS — M25562 Pain in left knee: Secondary | ICD-10-CM | POA: Diagnosis not present

## 2020-07-18 DIAGNOSIS — M1711 Unilateral primary osteoarthritis, right knee: Secondary | ICD-10-CM | POA: Diagnosis not present

## 2020-07-24 DIAGNOSIS — M25569 Pain in unspecified knee: Secondary | ICD-10-CM | POA: Diagnosis not present

## 2020-07-24 DIAGNOSIS — C50911 Malignant neoplasm of unspecified site of right female breast: Secondary | ICD-10-CM | POA: Diagnosis not present

## 2020-07-24 DIAGNOSIS — H3320 Serous retinal detachment, unspecified eye: Secondary | ICD-10-CM | POA: Diagnosis not present

## 2020-07-24 DIAGNOSIS — Z1212 Encounter for screening for malignant neoplasm of rectum: Secondary | ICD-10-CM | POA: Diagnosis not present

## 2020-07-24 DIAGNOSIS — J309 Allergic rhinitis, unspecified: Secondary | ICD-10-CM | POA: Diagnosis not present

## 2020-07-24 DIAGNOSIS — M810 Age-related osteoporosis without current pathological fracture: Secondary | ICD-10-CM | POA: Diagnosis not present

## 2020-07-24 DIAGNOSIS — Z0000 Encounter for general adult medical examination without abnormal findings: Secondary | ICD-10-CM | POA: Diagnosis not present

## 2020-07-24 DIAGNOSIS — R69 Illness, unspecified: Secondary | ICD-10-CM | POA: Diagnosis not present

## 2020-07-25 DIAGNOSIS — R82998 Other abnormal findings in urine: Secondary | ICD-10-CM | POA: Diagnosis not present

## 2020-07-31 DIAGNOSIS — M25561 Pain in right knee: Secondary | ICD-10-CM | POA: Diagnosis not present

## 2020-08-13 DIAGNOSIS — M25561 Pain in right knee: Secondary | ICD-10-CM | POA: Diagnosis not present

## 2020-08-13 DIAGNOSIS — M25562 Pain in left knee: Secondary | ICD-10-CM | POA: Diagnosis not present

## 2020-09-06 ENCOUNTER — Ambulatory Visit (INDEPENDENT_AMBULATORY_CARE_PROVIDER_SITE_OTHER): Payer: Medicare HMO | Admitting: Ophthalmology

## 2020-09-06 ENCOUNTER — Encounter (INDEPENDENT_AMBULATORY_CARE_PROVIDER_SITE_OTHER): Payer: Self-pay | Admitting: Ophthalmology

## 2020-09-06 ENCOUNTER — Other Ambulatory Visit: Payer: Self-pay

## 2020-09-06 DIAGNOSIS — H43811 Vitreous degeneration, right eye: Secondary | ICD-10-CM

## 2020-09-06 DIAGNOSIS — H2511 Age-related nuclear cataract, right eye: Secondary | ICD-10-CM

## 2020-09-06 NOTE — Assessment & Plan Note (Signed)

## 2020-09-06 NOTE — Progress Notes (Signed)
09/06/2020     CHIEF COMPLAINT Patient presents for  Chief Complaint  Patient presents with   Retina Follow Up      HISTORY OF PRESENT ILLNESS: Norma Mayer is a 70 y.o. female who presents to the clinic today for:   HPI     Retina Follow Up   Patient presents with  Other.  In both eyes.  This started 1 year ago.  Severity is mild.  Duration of 1 year.  Since onset it is stable.        Comments   1 yr fu ou oct Pt has an allergy to tropicamide. Pt states "I had a stye and infection in my left eye a couple weeks ago, I saw Dr. Ellie Lunch at [facility]. I use travatan in my left eye one time at night."       Last edited by Laurin Coder, COA on 09/06/2020  1:17 PM.      Referring physician: Prince Solian, MD [place],  [place] [address]  HISTORICAL INFORMATION:   Selected notes from the [place]: Current Outpatient Medications (Ophthalmic Drugs)  Medication Sig   Travoprost, BAK Free, (TRAVATAN) 0.004 % SOLN ophthalmic solution 1 drop at bedtime.   No current facility-administered medications for this visit. (Ophthalmic Drugs)   Current Outpatient Medications (Other)  Medication Sig   calcium-vitamin D (OSCAL WITH D) 500-200 MG-UNIT per tablet Take 1 tablet by mouth daily.   Cholecalciferol (VITAMIN D PO) Take by mouth.   clobetasol cream (TEMOVATE) [id number] % Apply 1 application topically daily. Apply a thin layer on the vulva and perianal area daily x 2 weeks, then twice a week.   triamcinolone cream (KENALOG) 0.1 % Apply 1 application topically 2 (two) times daily.   No current facility-administered medications for this visit. (Other)      REVIEW OF SYSTEMS:    ALLERGIES Allergies  Allergen Reactions   Combigan [Brimonidine Tartrate-Timolol]    Dorzolamide    Ketoconazole Nausea And Vomiting    headache   Mydriacyl [Tropicamide]    Other Itching    ADHESIVE  TAPE:redness   Timolol    Belladonna Alkaloids Palpitations    headache    PAST MEDICAL HISTORY Past Medical History:  Diagnosis Date   Breast cancer ([place]) 2006   Right   Cancer ([place]) 2006   BREAST CANCER/infiltrating ductal, ER/PR positive   Osteopenia 03/2015   T score -2.4 FRAX 7.5%/0.5%  stable from prior DEXA   Retinal detachment    Past Surgical History:  Procedure Laterality Date   ABDOMINAL HYSTERECTOMY  2003   TAH,BSO mucinous cystadenoma/leiomyoma/endosalpingosis   BREAST BIOPSY  08/1999   X 2 BREAST BX--ATYPICAL DUCTAL HYPERPLASIA   BREAST LUMPECTOMY Right 2006   BREAST CANCER 2006   EYE SURGERY  2009/2010   RETINA EYE SURGERY   OOPHORECTOMY     BSO   RETINAL DETACHMENT SURGERY  2009-2010   TONSILLECTOMY  1958    FAMILY HISTORY Family History  Problem Relation Age of Onset   Hypertension Father    Cancer Mother        UTERINE   Heart disease Mother    Breast cancer Neg Hx     SOCIAL HISTORY Social History   Tobacco Use   Smoking status: Former   Smokeless tobacco: Never  Vaping Use   Vaping Use: Never used  Substance Use Topics   Alcohol use: Yes  Alcohol/week: 0.0 standard drinks    Comment: MODERATE   Drug use: No         OPHTHALMIC EXAM:  Base Eye Exam     Visual Acuity (ETDRS)       Right Left   Dist cc 20/20 -1 20/100   Dist ph cc  NI    Correction: Glasses         Tonometry (Tonopen, 1:14 PM)       Right Left   Pressure 10 10         Pupils       Dark Light Shape React APD   Right 3 2 Round Brisk None   Left   Irregular  None         Visual Fields (Counting fingers)       Left Right     Full   Restrictions Partial outer superior nasal deficiency          Extraocular Movement       Right Left    Full Full         Neuro/Psych     Oriented x3: Yes   Mood/Affect: Normal         Dilation     Both eyes: 2.5% Phenylephrine @ 1:17 PM           Slit Lamp and Fundus Exam      External Exam       Right Left   External Normal Normal         Slit Lamp Exam       Right Left   Lids/Lashes Normal Normal   Conjunctiva/Sclera White and quiet White and quiet   Cornea Clear Clear   Anterior Chamber Deep and quiet Deep and quiet   Iris Round and reactive Round and reactive   Lens 2+ Nuclear sclerosis Centered posterior chamber intraocular lens   Anterior Vitreous Normal Normal         Fundus Exam       Right Left   Posterior Vitreous Posterior vitreous detachment Clear, vitrectomized   Disc Normal Normal   C/D Ratio 0.6 0.65   Macula Normal Normal   Vessels Normal Normal   Periphery No peripheral retinal holes or tears Retinopexy, retina attached, no new retinal breaks            IMAGING AND PROCEDURES  Imaging and Procedures for 09/06/20  OCT, Retina - OU - Both Eyes       Right Eye Quality was good. Scan locations included subfoveal. Central Foveal Thickness: 278. Progression has improved.   Left Eye Quality was good. Scan locations included subfoveal. Central Foveal Thickness: 280. Progression has been stable.              ASSESSMENT/PLAN:  Nuclear sclerotic cataract of right eye The nature of cataract was discussed with the patient as well as the elective nature of surgery. The patient was reassured that surgery at a later date does not put the patient at risk for a worse outcome. It was emphasized that the need for surgery is dictated by the patient's quality of life as influenced by the cataract. Patient was instructed to maintain close follow up with their general eye care doctor.     ICD-10-CM   1. Posterior vitreous detachment of right eye  H43.811 OCT, Retina - OU - Both Eyes    2. Nuclear sclerotic cataract of right eye  H25.11       1.  OS  with a history of retinal detachment, retina completely attached.  2.  Excellent vision OD nuclear sclerotic cataract mild no holes or tears   3.  Ophthalmic Meds Ordered this  visit:  No orders of the defined types were placed in this encounter.      Return in about 1 year (around 09/06/2021) for DILATE OU, OCT, COLOR FP.  There are no Patient Instructions on file for this visit.   Explained the diagnoses, plan, and follow up with the patient and they expressed understanding.  Patient expressed understanding of the importance of proper follow up care.   Clent Demark Devondre Guzzetta M.D. Diseases & Surgery of the Retina and Vitreous Retina & Diabetic [place] 09/06/20     Abbreviations: M myopia (nearsighted); A astigmatism; H hyperopia (farsighted); P presbyopia; Mrx spectacle prescription;  CTL contact lenses; OD right eye; OS left eye; OU both eyes  XT exotropia; ET esotropia; PEK punctate epithelial keratitis; PEE punctate epithelial erosions; DES dry eye syndrome; MGD meibomian gland dysfunction; ATs artificial tears; PFAT's preservative free artificial tears; [place] nuclear sclerotic cataract; PSC posterior subcapsular cataract; ERM epi-retinal membrane; PVD posterior vitreous detachment; RD retinal detachment; DM diabetes mellitus; DR diabetic retinopathy; NPDR non-proliferative diabetic retinopathy; PDR proliferative diabetic retinopathy; CSME clinically significant macular edema; DME diabetic macular edema; dbh dot blot hemorrhages; CWS cotton wool spot; POAG primary open angle glaucoma; C/D cup-to-disc ratio; HVF humphrey visual field; GVF goldmann visual field; OCT optical coherence tomography; IOP intraocular pressure; BRVO Branch retinal vein occlusion; CRVO central retinal vein occlusion; CRAO central retinal artery occlusion; BRAO branch retinal artery occlusion; RT retinal tear; SB scleral buckle; PPV pars plana vitrectomy; VH Vitreous hemorrhage; PRP panretinal laser photocoagulation; IVK intravitreal kenalog; VMT vitreomacular traction; MH Macular hole;  NVD neovascularization of the disc; NVE neovascularization elsewhere; AREDS age related eye disease study; ARMD  age related macular degeneration; POAG primary open angle glaucoma; EBMD epithelial/anterior basement membrane dystrophy; ACIOL anterior chamber intraocular lens; IOL intraocular lens; PCIOL posterior chamber intraocular lens; Phaco/IOL phacoemulsification with intraocular lens placement; Canon photorefractive keratectomy; LASIK laser assisted in situ keratomileusis; HTN hypertension; DM diabetes mellitus; COPD chronic obstructive pulmonary disease

## 2020-09-19 DIAGNOSIS — H6123 Impacted cerumen, bilateral: Secondary | ICD-10-CM | POA: Diagnosis not present

## 2020-09-19 DIAGNOSIS — L299 Pruritus, unspecified: Secondary | ICD-10-CM | POA: Diagnosis not present

## 2020-09-19 DIAGNOSIS — H938X3 Other specified disorders of ear, bilateral: Secondary | ICD-10-CM | POA: Diagnosis not present

## 2020-09-28 DIAGNOSIS — H02052 Trichiasis without entropian right lower eyelid: Secondary | ICD-10-CM | POA: Diagnosis not present

## 2020-10-01 ENCOUNTER — Other Ambulatory Visit: Payer: Self-pay | Admitting: Internal Medicine

## 2020-10-01 DIAGNOSIS — Z1231 Encounter for screening mammogram for malignant neoplasm of breast: Secondary | ICD-10-CM

## 2020-11-07 ENCOUNTER — Other Ambulatory Visit: Payer: Self-pay

## 2020-11-07 ENCOUNTER — Ambulatory Visit
Admission: RE | Admit: 2020-11-07 | Discharge: 2020-11-07 | Disposition: A | Discharge status: Home / Self Care | Payer: Medicare HMO | Source: Ambulatory Visit | Attending: Internal Medicine | Admitting: Internal Medicine

## 2020-11-07 DIAGNOSIS — Z1231 Encounter for screening mammogram for malignant neoplasm of breast: Secondary | ICD-10-CM | POA: Diagnosis not present

## 2020-11-08 DIAGNOSIS — Z85828 Personal history of other malignant neoplasm of skin: Secondary | ICD-10-CM | POA: Diagnosis not present

## 2020-11-08 DIAGNOSIS — L433 Subacute (active) lichen planus: Secondary | ICD-10-CM | POA: Diagnosis not present

## 2020-11-08 DIAGNOSIS — D045 Carcinoma in situ of skin of trunk: Secondary | ICD-10-CM | POA: Diagnosis not present

## 2020-11-08 DIAGNOSIS — L821 Other seborrheic keratosis: Secondary | ICD-10-CM | POA: Diagnosis not present

## 2020-11-08 DIAGNOSIS — D224 Melanocytic nevi of scalp and neck: Secondary | ICD-10-CM | POA: Diagnosis not present

## 2020-11-08 DIAGNOSIS — D485 Neoplasm of uncertain behavior of skin: Secondary | ICD-10-CM | POA: Diagnosis not present

## 2020-11-14 DIAGNOSIS — H4052X1 Glaucoma secondary to other eye disorders, left eye, mild stage: Secondary | ICD-10-CM | POA: Diagnosis not present

## 2020-12-10 DIAGNOSIS — L308 Other specified dermatitis: Secondary | ICD-10-CM | POA: Diagnosis not present

## 2020-12-10 DIAGNOSIS — L821 Other seborrheic keratosis: Secondary | ICD-10-CM | POA: Diagnosis not present

## 2020-12-10 DIAGNOSIS — L720 Epidermal cyst: Secondary | ICD-10-CM | POA: Diagnosis not present

## 2020-12-10 DIAGNOSIS — Z85828 Personal history of other malignant neoplasm of skin: Secondary | ICD-10-CM | POA: Diagnosis not present

## 2021-02-19 DIAGNOSIS — H00012 Hordeolum externum right lower eyelid: Secondary | ICD-10-CM | POA: Diagnosis not present

## 2021-02-19 DIAGNOSIS — H01002 Unspecified blepharitis right lower eyelid: Secondary | ICD-10-CM | POA: Diagnosis not present

## 2021-02-19 DIAGNOSIS — H02052 Trichiasis without entropian right lower eyelid: Secondary | ICD-10-CM | POA: Diagnosis not present

## 2021-02-19 DIAGNOSIS — H10411 Chronic giant papillary conjunctivitis, right eye: Secondary | ICD-10-CM | POA: Diagnosis not present

## 2021-04-16 ENCOUNTER — Emergency Department (HOSPITAL_COMMUNITY)
Admission: EM | Admit: 2021-04-16 | Discharge: 2021-04-17 | Disposition: A | Discharge status: Home / Self Care | Payer: Medicare HMO | Attending: Emergency Medicine | Admitting: Emergency Medicine

## 2021-04-16 ENCOUNTER — Ambulatory Visit (HOSPITAL_COMMUNITY)
Admission: EM | Admit: 2021-04-16 | Discharge: 2021-04-16 | Disposition: A | Discharge status: Home / Self Care | Payer: Medicare HMO

## 2021-04-16 ENCOUNTER — Other Ambulatory Visit: Payer: Self-pay

## 2021-04-16 ENCOUNTER — Encounter (HOSPITAL_COMMUNITY): Payer: Self-pay

## 2021-04-16 DIAGNOSIS — S0990XA Unspecified injury of head, initial encounter: Secondary | ICD-10-CM | POA: Diagnosis not present

## 2021-04-16 DIAGNOSIS — S0003XA Contusion of scalp, initial encounter: Secondary | ICD-10-CM | POA: Insufficient documentation

## 2021-04-16 DIAGNOSIS — W228XXA Striking against or struck by other objects, initial encounter: Secondary | ICD-10-CM | POA: Diagnosis not present

## 2021-04-16 NOTE — ED Triage Notes (Signed)
Pt arrives to ED POV c/o head pain. Pt states she was moving a microwave on to a "hand truck" and when she placed the microwave down the handles of the hand truck came up and hit her on the head. Denies LOC and blood thinners. No bleeding, bruising or abrasions noticed. Pt a/o x4 ?

## 2021-04-16 NOTE — ED Notes (Signed)
Pt left without being seen.

## 2021-04-17 ENCOUNTER — Emergency Department (HOSPITAL_COMMUNITY): Payer: Medicare HMO

## 2021-04-17 DIAGNOSIS — S0990XA Unspecified injury of head, initial encounter: Secondary | ICD-10-CM | POA: Diagnosis not present

## 2021-04-17 MED ORDER — ACETAMINOPHEN 500 MG PO TABS: 500.0000 mg | ORAL_TABLET | Freq: Four times a day (QID) | ORAL | 0 refills | Status: DC | PRN

## 2021-04-17 MED ORDER — ONDANSETRON 4 MG PO TBDP: 4.0000 mg | ORAL_TABLET | Freq: Three times a day (TID) | ORAL | 0 refills | Status: DC | PRN

## 2021-04-17 NOTE — ED Notes (Signed)
Patient verbalizes understanding of discharge instructions. Opportunity for questioning and answers were provided. Armband removed by staff, pt discharged from ED. Pt left prior to vitals being taken.  ?

## 2021-04-17 NOTE — ED Provider Notes (Signed)
?[facility] ?Provider Note ? ? ?CSN: [id number] ?Arrival date & time: 04/16/21  1901 ? ?  ? ?History ? ?Chief Complaint  ?Patient presents with  ? Head Injury  ? ? ?Mimi Debellis is a 71 y.o. female. ? ? ?Head Injury ? ?Patient is a 71 year old female who presented to the emergency room today after head injury that occurred yesterday.  She has been in the emergency room for 13 hours on my evaluation. ? ?She states that she was moving a microwave onto a hand truck in the process she was pulling upwards on the hand truck handles when the handle came off of the hand truck and smacked her in the head.  Seems to have hit approximately the hairline/crown of her head.  She did not lose consciousness she is not taking any blood thinners she has not had any bruising or bleeding. ? ?No nausea or vomiting and no headaches.  She denies any pain anywhere else.  No other associate symptoms. ? ? ?  ? ?Home Medications ?Prior to Admission medications   ?Medication Sig Start Date End Date Taking? Authorizing Provider  ?acetaminophen (TYLENOL) 500 MG tablet Take 1-2 tablets (500-1,000 mg total) by mouth every 6 (six) hours as needed. 04/17/21  Yes Mujtaba Bollig, Ova Freshwater S, PA  ?ondansetron (ZOFRAN-ODT) 4 MG disintegrating tablet Take 1 tablet (4 mg total) by mouth every 8 (eight) hours as needed for nausea or vomiting. 04/17/21  Yes Kara Melching, Kathleene Hazel, PA  ?calcium-vitamin D (OSCAL WITH D) 500-200 MG-UNIT per tablet Take 1 tablet by mouth daily.    [provider]  ?Cholecalciferol (VITAMIN D PO) Take by mouth.    [provider]  ?Travoprost, BAK Free, (TRAVATAN) 0.004 % SOLN ophthalmic solution 1 drop at bedtime.    [provider]  ?triamcinolone cream (KENALOG) 0.1 % Apply 1 application topically 2 (two) times daily. 09/20/19   Joseph Pierini, MD  ?   ? ?Allergies    ?Combigan [brimonidine tartrate-timolol], Dorzolamide, Ketoconazole, Mydriacyl [tropicamide], Other,  Timolol, and Belladonna alkaloids   ? ?Review of Systems   ?Review of Systems ? ?Physical Exam ?Updated Vital Signs ?BP 138/77 (BP Location: Left Arm)   Pulse 77   Temp 99.1 ?F (37.3 ?C)   Resp 16   SpO2 96%  ?Physical Exam ?Vitals and nursing note reviewed.  ?Constitutional:   ?   General: She is not in acute distress. ?HENT:  ?   Head: Normocephalic and atraumatic.  ?   Nose: Nose normal.  ?   Mouth/Throat:  ?   Mouth: Mucous membranes are moist.  ?Eyes:  ?   General: No scleral icterus. ?Cardiovascular:  ?   Rate and Rhythm: Normal rate and regular rhythm.  ?   Pulses: Normal pulses.  ?   Heart sounds: Normal heart sounds.  ?Pulmonary:  ?   Effort: Pulmonary effort is normal. No respiratory distress.  ?   Breath sounds: No wheezing.  ?Abdominal:  ?   Palpations: Abdomen is soft.  ?   Tenderness: There is no abdominal tenderness. There is no guarding or rebound.  ?Musculoskeletal:  ?   Cervical back: Normal range of motion.  ?   Right lower leg: No edema.  ?   Left lower leg: No edema.  ?   Comments: No bony tenderness over joints or long bones of the upper and lower extremities.   ? ?No neck or back midline tenderness, step-off, deformity, or bruising. Able to turn  head left and right 45 degrees without difficulty. ? ?Full range of motion of upper and lower extremity joints shown after palpation was conducted; with 5/5 symmetrical strength in upper and lower extremities. No chest wall tenderness, no facial or cranial tenderness.  ? ?Patient has intact sensation grossly in lower and upper extremities. Intact patellar and ankle reflexes. Patient able to ambulate without difficulty.  ?Radial and DP pulses palpated BL.  ?   ?Skin: ?   General: Skin is warm and dry.  ?   Capillary Refill: Capillary refill takes less than 2 seconds.  ?Neurological:  ?   Mental Status: She is alert. Mental status is at baseline.  ?   Comments: Alert and oriented to self, place, time and event.  ? ?Speech is fluent, clear without  dysarthria or dysphasia.  ? ?Strength 5/5 in upper/lower extremities   ?Sensation intact in upper/lower extremities  ? ?Normal gait.  ?CN I not tested  ?CN II grossly intact visual fields bilaterally. Did not visualize posterior eye.  ?CN III, IV, VI PERRLA and EOMs intact bilaterally  ?CN V Intact sensation to sharp and light touch to the face  ?CN VII facial movements symmetric  ?CN VIII not tested  ?CN IX, X no uvula deviation, symmetric rise of soft palate  ?CN XI 5/5 SCM and trapezius strength bilaterally  ?CN XII Midline tongue protrusion, symmetric L/R movements   ?Psychiatric:     ?   Mood and Affect: Mood normal.     ?   Behavior: Behavior normal.  ? ? ?ED Results / Procedures / Treatments   ?Labs ?(all labs ordered are listed, but only abnormal results are displayed) ?Labs Reviewed - No data to display ? ?EKG ?None ? ?Radiology ?CT HEAD WO CONTRAST (5MM) ? ?Result Date: 04/17/2021 ?CLINICAL DATA:  Moderate to severe head trauma. EXAM: CT HEAD WITHOUT CONTRAST TECHNIQUE: Contiguous axial images were obtained from the base of the skull through the vertex without intravenous contrast. RADIATION DOSE REDUCTION: This exam was performed according to the departmental dose-optimization program which includes automated exposure control, adjustment of the mA and/or kV according to patient size and/or use of iterative reconstruction technique. COMPARISON:  None. FINDINGS: Brain: No evidence of acute infarction, hemorrhage, hydrocephalus, extra-axial collection or mass lesion/mass effect. Low-density in the biparietal white matter attributed to chronic small vessel ischemia. Age normal brain volume. Vascular: No hyperdense vessel or unexpected calcification. Skull: Negative for fracture Sinuses/Orbits: Left cataract resection and scleral banding. IMPRESSION: No evidence of intracranial injury. Electronically Signed   By: Jorje Guild M.D.   On: 04/17/2021 05:46   ? ?Procedures ?Procedures  ? ? ?Medications Ordered in  ED ?Medications - No data to display ? ?ED Course/ Medical Decision Making/ A&P ?  ?                        ?Medical Decision Making ?Risk ?OTC drugs. ?Prescription drug management. ? ? ?Patient is a 71 year old female who presented to the emergency room today after head injury that occurred yesterday.  She has been in the emergency room for 13 hours on my evaluation. ? ?She states that she was moving a microwave onto a hand truck in the process she was pulling upwards on the hand truck handles when the handle came off of the hand truck and smacked her in the head.  Seems to have hit approximately the hairline/crown of her head.  She did not lose consciousness she is  not taking any blood thinners she has not had any bruising or bleeding. ? ?No nausea or vomiting and no headaches.  She denies any pain anywhere else.  No other associate symptoms. ? ? ?PE: Physical exam without any scalp tenderness hematoma.  Neurologically intact. ? ?Patient ambulated during my examination. ? ?Patient is a 71 year old female here after very low velocity head injury.  She did not lose consciousness she is well-appearing on exam this occurred over 13 hours ago she has been observed here in the emergency room waiting room and had no recurrence of headaches or neurologic abnormalities. ? ?Reassuring physical exam.  Recommend follow-up with PCP.  Return precautions were given. ? ? ?Final Clinical Impression(s) / ED Diagnoses ?Final diagnoses:  ?Injury of head, initial encounter  ?Contusion of scalp, initial encounter  ? ? ?Rx / DC Orders ?ED Discharge Orders   ? ?      Ordered  ?  ondansetron (ZOFRAN-ODT) 4 MG disintegrating tablet  Every 8 hours PRN       ? 04/17/21 0731  ?  acetaminophen (TYLENOL) 500 MG tablet  Every 6 hours PRN       ? 04/17/21 0731  ? ?  ?  ? ?  ? ? ?  ?Tedd Sias, [place] ?04/17/21 9326 ? ?  ?Wyvonnia Dusky, MD ?04/17/21 [phone] ? ?

## 2021-04-17 NOTE — Discharge Instructions (Addendum)
Drink plenty of water.  ?Follow up with your primary care provider.  ?Return to the ER for any concerning symptoms as discussed ? ? ?AS NEEDED ?Tylenol/aleve for headaches  ?Zofran for nausea ?

## 2021-05-15 DIAGNOSIS — H0100A Unspecified blepharitis right eye, upper and lower eyelids: Secondary | ICD-10-CM | POA: Diagnosis not present

## 2021-05-15 DIAGNOSIS — H2000 Unspecified acute and subacute iridocyclitis: Secondary | ICD-10-CM | POA: Diagnosis not present

## 2021-05-15 DIAGNOSIS — H0100B Unspecified blepharitis left eye, upper and lower eyelids: Secondary | ICD-10-CM | POA: Diagnosis not present

## 2021-05-15 DIAGNOSIS — H25811 Combined forms of age-related cataract, right eye: Secondary | ICD-10-CM | POA: Diagnosis not present

## 2021-05-17 DIAGNOSIS — H0100B Unspecified blepharitis left eye, upper and lower eyelids: Secondary | ICD-10-CM | POA: Diagnosis not present

## 2021-05-17 DIAGNOSIS — H0100A Unspecified blepharitis right eye, upper and lower eyelids: Secondary | ICD-10-CM | POA: Diagnosis not present

## 2021-05-17 DIAGNOSIS — H2000 Unspecified acute and subacute iridocyclitis: Secondary | ICD-10-CM | POA: Diagnosis not present

## 2021-05-21 ENCOUNTER — Ambulatory Visit (INDEPENDENT_AMBULATORY_CARE_PROVIDER_SITE_OTHER): Payer: Medicare HMO | Admitting: Obstetrics & Gynecology

## 2021-05-21 ENCOUNTER — Encounter: Payer: Self-pay | Admitting: Obstetrics & Gynecology

## 2021-05-21 VITALS — BP 124/84 | Ht 61.75 in

## 2021-05-21 DIAGNOSIS — M810 Age-related osteoporosis without current pathological fracture: Secondary | ICD-10-CM | POA: Diagnosis not present

## 2021-05-21 DIAGNOSIS — Z01419 Encounter for gynecological examination (general) (routine) without abnormal findings: Secondary | ICD-10-CM

## 2021-05-21 DIAGNOSIS — Z780 Asymptomatic menopausal state: Secondary | ICD-10-CM

## 2021-05-21 DIAGNOSIS — N904 Leukoplakia of vulva: Secondary | ICD-10-CM

## 2021-05-21 DIAGNOSIS — Z853 Personal history of malignant neoplasm of breast: Secondary | ICD-10-CM | POA: Diagnosis not present

## 2021-05-21 DIAGNOSIS — Z90710 Acquired absence of both cervix and uterus: Secondary | ICD-10-CM | POA: Diagnosis not present

## 2021-05-21 MED ORDER — CLOBETASOL PROPIONATE 0.05 % EX OINT: 1.0000 "application " | TOPICAL_OINTMENT | CUTANEOUS | 4 refills | Status: DC

## 2021-05-21 NOTE — Progress Notes (Signed)
? ? ?Norma Mayer [birth year] [id number] ? ? ?History:    71 y.o. G0 Divorced. ?  ?RP: Established patient presenting for annual gyn exam  ?  ?HPI: S/P Total Hysterectomy.  Postmenopause, well on no HRT.  No pelvic pain.  Abstinent.  Pap Neg 03/2019.  Remote h/o Cervical Dysplasia post CryoRx in her 75's.  Lichen Sclerosus of the vulva. H/O Rt Breast Ca. Breasts normal.  Mammo 10/2020 Neg.  BD Osteoporosis in 06/2019.  Repeat BD.  Urine/BMs normal.  BMI 31.16.  Health labs with Fam MD.  Harriet Masson 2014. ?  ?Past medical history,surgical history, family history and social history were all reviewed and documented in the EPIC chart. ? ?Gynecologic History ?No LMP recorded. Patient has had a hysterectomy. ? ?Obstetric History ?OB History  ?Gravida Para Term Preterm AB Living  ?0            ?SAB IAB Ectopic Multiple Live Births  ?           ? ? ? ?ROS: A ROS was performed and pertinent positives and negatives are included in the history. ? GENERAL: No fevers or chills. HEENT: No change in vision, no earache, sore throat or sinus congestion. NECK: No pain or stiffness. CARDIOVASCULAR: No chest pain or pressure. No palpitations. PULMONARY: No shortness of breath, cough or wheeze. GASTROINTESTINAL: No abdominal pain, nausea, vomiting or diarrhea, melena or bright red blood per rectum. GENITOURINARY: No urinary frequency, urgency, hesitancy or dysuria. MUSCULOSKELETAL: No joint or muscle pain, no back pain, no recent trauma. DERMATOLOGIC: No rash, no itching, no lesions. ENDOCRINE: No polyuria, polydipsia, no heat or cold intolerance. No recent change in weight. HEMATOLOGICAL: No anemia or easy bruising or bleeding. NEUROLOGIC: No headache, seizures, numbness, tingling or weakness. PSYCHIATRIC: No depression, no loss of interest in normal activity or change in sleep pattern.  ?  ? ?Exam: ? ? ?BP 124/84   Ht 5' 1.75" (1.568 m)   BMI 31.16 kg/m?  ? ?Body mass index is 31.16 kg/m?. ? ?General appearance : Well developed  well nourished female. No acute distress ?HEENT: Eyes: no retinal hemorrhage or exudates,  Neck supple, trachea midline, no carotid bruits, no thyroidmegaly ?Lungs: Clear to auscultation, no rhonchi or wheezes, or rib retractions  ?Heart: Regular rate and rhythm, no murmurs or gallops ?Breast:Examined in sitting and supine position were symmetrical in appearance, no palpable masses or tenderness,  no skin retraction, no nipple inversion, no nipple discharge, no skin discoloration, no axillary or supraclavicular lymphadenopathy ?Abdomen: no palpable masses or tenderness, no rebound or guarding ?Extremities: no edema or skin discoloration or tenderness ? ?Pelvic: Vulva: Normal ?            Vagina: No gross lesions or discharge ? Cervix/Uterus absent ? Adnexa  Without masses or tenderness ? Anus: Normal ? ? ?Assessment/Plan:  71 y.o. female for annual exam  ? ?1. Well female exam with routine gynecological exam ?S/P Total Hysterectomy/BSO.  Postmenopause, well on no HRT.  No pelvic pain.  Abstinent.  Pap Neg 03/2019.  Remote h/o Cervical Dysplasia post CryoRx in her 30's.  Lichen Sclerosus of the vulva. H/O Rt Breast Ca. Breasts normal.  Mammo 10/2020 Neg.  BD Osteoporosis in 06/2019.  Repeat BD.  Urine/BMs normal.  BMI 31.16.  Health labs with Fam MD.  Harriet Masson 2014. ?  ?2. S/P total hysterectomy/BSO ? ?3. Postmenopause ?S/P Total Hysterectomy/BSO.  Postmenopause, well on no HRT.  No pelvic pain.  Abstinent.   ? ?4. Postmenopausal  osteoporosis ? BD Osteoporosis in 06/2019.  Repeat BD. Vit D, Ca++, weight bearing. ? ?5. Personal history of breast cancer ? ?6. Lichen sclerosus et atrophicus of the vulva ?Change to Clobetasol ointment.  Use twice a week.  Prescription sent to pharmacy. ? ?Other orders ?- CALCIUM PO; Take by mouth. ?- clobetasol ointment (TEMOVATE) 0.05 %; Apply 1 application. topically 2 (two) times a week.  ? ?Princess Bruins MD, 10:25 AM 05/21/2021 ? ?  ?

## 2021-05-22 ENCOUNTER — Other Ambulatory Visit: Payer: Self-pay | Admitting: *Deleted

## 2021-05-22 DIAGNOSIS — M810 Age-related osteoporosis without current pathological fracture: Secondary | ICD-10-CM

## 2021-06-05 DIAGNOSIS — H4052X0 Glaucoma secondary to other eye disorders, left eye, stage unspecified: Secondary | ICD-10-CM | POA: Diagnosis not present

## 2021-06-05 DIAGNOSIS — H2511 Age-related nuclear cataract, right eye: Secondary | ICD-10-CM | POA: Diagnosis not present

## 2021-06-05 DIAGNOSIS — H2000 Unspecified acute and subacute iridocyclitis: Secondary | ICD-10-CM | POA: Diagnosis not present

## 2021-06-05 DIAGNOSIS — H52203 Unspecified astigmatism, bilateral: Secondary | ICD-10-CM | POA: Diagnosis not present

## 2021-07-10 DIAGNOSIS — H4052X0 Glaucoma secondary to other eye disorders, left eye, stage unspecified: Secondary | ICD-10-CM | POA: Diagnosis not present

## 2021-08-12 DIAGNOSIS — M810 Age-related osteoporosis without current pathological fracture: Secondary | ICD-10-CM | POA: Diagnosis not present

## 2021-08-12 DIAGNOSIS — Z0000 Encounter for general adult medical examination without abnormal findings: Secondary | ICD-10-CM | POA: Diagnosis not present

## 2021-08-12 DIAGNOSIS — R7989 Other specified abnormal findings of blood chemistry: Secondary | ICD-10-CM | POA: Diagnosis not present

## 2021-08-14 DIAGNOSIS — Z0000 Encounter for general adult medical examination without abnormal findings: Secondary | ICD-10-CM | POA: Diagnosis not present

## 2021-08-19 DIAGNOSIS — M810 Age-related osteoporosis without current pathological fracture: Secondary | ICD-10-CM | POA: Diagnosis not present

## 2021-08-19 DIAGNOSIS — J309 Allergic rhinitis, unspecified: Secondary | ICD-10-CM | POA: Diagnosis not present

## 2021-08-19 DIAGNOSIS — Z0000 Encounter for general adult medical examination without abnormal findings: Secondary | ICD-10-CM | POA: Diagnosis not present

## 2021-08-19 DIAGNOSIS — H3320 Serous retinal detachment, unspecified eye: Secondary | ICD-10-CM | POA: Diagnosis not present

## 2021-08-19 DIAGNOSIS — R011 Cardiac murmur, unspecified: Secondary | ICD-10-CM | POA: Diagnosis not present

## 2021-08-19 DIAGNOSIS — R69 Illness, unspecified: Secondary | ICD-10-CM | POA: Diagnosis not present

## 2021-08-19 DIAGNOSIS — M25569 Pain in unspecified knee: Secondary | ICD-10-CM | POA: Diagnosis not present

## 2021-08-19 DIAGNOSIS — Z853 Personal history of malignant neoplasm of breast: Secondary | ICD-10-CM | POA: Diagnosis not present

## 2021-08-19 DIAGNOSIS — R82998 Other abnormal findings in urine: Secondary | ICD-10-CM | POA: Diagnosis not present

## 2021-09-03 ENCOUNTER — Other Ambulatory Visit: Payer: Self-pay | Admitting: Internal Medicine

## 2021-09-03 DIAGNOSIS — Z1231 Encounter for screening mammogram for malignant neoplasm of breast: Secondary | ICD-10-CM

## 2021-09-05 ENCOUNTER — Other Ambulatory Visit (HOSPITAL_COMMUNITY): Payer: Self-pay | Admitting: Internal Medicine

## 2021-09-05 DIAGNOSIS — R011 Cardiac murmur, unspecified: Secondary | ICD-10-CM

## 2021-09-09 ENCOUNTER — Encounter (INDEPENDENT_AMBULATORY_CARE_PROVIDER_SITE_OTHER): Payer: Self-pay | Admitting: Ophthalmology

## 2021-09-09 ENCOUNTER — Ambulatory Visit (HOSPITAL_COMMUNITY): Payer: Medicare HMO | Attending: Internal Medicine

## 2021-09-09 ENCOUNTER — Ambulatory Visit (INDEPENDENT_AMBULATORY_CARE_PROVIDER_SITE_OTHER): Payer: Medicare HMO | Admitting: Ophthalmology

## 2021-09-09 DIAGNOSIS — Z8669 Personal history of other diseases of the nervous system and sense organs: Secondary | ICD-10-CM

## 2021-09-09 DIAGNOSIS — H43811 Vitreous degeneration, right eye: Secondary | ICD-10-CM | POA: Diagnosis not present

## 2021-09-09 DIAGNOSIS — H401131 Primary open-angle glaucoma, bilateral, mild stage: Secondary | ICD-10-CM

## 2021-09-09 DIAGNOSIS — R011 Cardiac murmur, unspecified: Secondary | ICD-10-CM | POA: Insufficient documentation

## 2021-09-09 DIAGNOSIS — H2511 Age-related nuclear cataract, right eye: Secondary | ICD-10-CM | POA: Diagnosis not present

## 2021-09-09 LAB — ECHOCARDIOGRAM COMPLETE
Area-P 1/2: 4.21 cm2
P 1/2 time: 404 msec
S' Lateral: 2.2 cm

## 2021-09-09 NOTE — Assessment & Plan Note (Signed)
History of retinal detachment, repaired via vitrectomy combined with buckle in the past OS doing well.

## 2021-09-09 NOTE — Progress Notes (Signed)
09/09/2021     CHIEF COMPLAINT Patient presents for  Chief Complaint  Patient presents with   Posterior Vitreous Detachment    History of retinal detachment left eye  HISTORY OF PRESENT ILLNESS: Norma Mayer is a 71 y.o. female who presents to the clinic today for:   HPI   1 YR FU OU OCT FP. Pt stated, "Ive had some inflammations the past 6 months so I had to go see Dr. Ellie Lunch. So she thought I had a reaction from the Travoprost. It was hurting and it was real itchy too." Pt has an allergy to timolol, tropicamide and dorz.  Last edited by Hurman Horn, MD on 09/09/2021  1:39 PM.      Referring physician: Luberta Mutter, MD [place],  [place] [id number]  HISTORICAL INFORMATION:   Selected notes from the MEDICAL RECORD NUMBER       CURRENT MEDICATIONS: Current Outpatient Medications (Ophthalmic Drugs)  Medication Sig   Travoprost, BAK Free, (TRAVATAN) 0.004 % SOLN ophthalmic solution 1 drop at bedtime.   No current facility-administered medications for this visit. (Ophthalmic Drugs)   Current Outpatient Medications (Other)  Medication Sig   CALCIUM PO Take by mouth.   Cholecalciferol (VITAMIN D PO) Take by mouth.   clobetasol ointment (TEMOVATE) 7.90 % Apply 1 application. topically 2 (two) times a week.   No current facility-administered medications for this visit. (Other)      REVIEW OF SYSTEMS: ROS   Negative for: Constitutional, Gastrointestinal, Neurological, Skin, Genitourinary, Musculoskeletal, HENT, Endocrine, Cardiovascular, Eyes, Respiratory, Psychiatric, Allergic/Imm, Heme/Lymph Last edited by Silvestre Moment on 09/09/2021  1:19 PM.       ALLERGIES Allergies  Allergen Reactions   Combigan [Brimonidine Tartrate-Timolol]    Dorzolamide    Ketoconazole Nausea And Vomiting    headache   Mydriacyl [Tropicamide]    Other Itching    ADHESIVE TAPE:redness   Timolol    Belladonna Alkaloids Palpitations    headache    PAST  MEDICAL HISTORY Past Medical History:  Diagnosis Date   Breast cancer ([facility]) 2006   Right   Cancer ([facility]) 2006   BREAST CANCER/infiltrating ductal, ER/PR positive   Osteopenia 03/2015   T score -2.4 FRAX 7.5%/0.5%  stable from prior DEXA   Retinal detachment    Past Surgical History:  Procedure Laterality Date   ABDOMINAL HYSTERECTOMY  2003   TAH,BSO mucinous cystadenoma/leiomyoma/endosalpingosis   BREAST BIOPSY  08/1999   X 2 BREAST BX--ATYPICAL DUCTAL HYPERPLASIA   BREAST LUMPECTOMY Right 2006   BREAST CANCER 2006   EYE SURGERY  2009/2010   RETINA EYE SURGERY   OOPHORECTOMY     BSO   RETINAL DETACHMENT SURGERY  2009-2010   TONSILLECTOMY  1958    FAMILY HISTORY Family History  Problem Relation Age of Onset   Hypertension Father    Cancer Mother        UTERINE   Heart disease Mother    Breast cancer Neg Hx     SOCIAL HISTORY Social History   Tobacco Use   Smoking status: Former   Smokeless tobacco: Never  Scientific laboratory technician Use: Never used  Substance Use Topics   Alcohol use: Not Currently    Comment: occ   Drug use: No         OPHTHALMIC EXAM:  Base Eye Exam     Visual Acuity (ETDRS)       Right Left   Dist cc 20/20 -1  20/150   Dist ph cc  NI    Correction: Glasses         Tonometry (Tonopen, 1:26 PM)       Right Left   Pressure 15 21         Pupils       Pupils Shape APD   Right PERRL Round None   Left PERRL Irregular None         Visual Fields       Left Right     Full   Restrictions Partial outer superior temporal deficiency          Extraocular Movement       Right Left    Full, Ortho Full, Ortho         Neuro/Psych     Oriented x3: Yes   Mood/Affect: Normal         Dilation     Both eyes: 1.0% Mydriacyl, 2.5% Phenylephrine @ 1:26 PM           Slit Lamp and Fundus Exam     External Exam       Right Left   External Normal Normal         Slit Lamp Exam       Right Left   Lids/Lashes  Normal Normal   Conjunctiva/Sclera White and quiet White and quiet   Cornea Clear Clear   Anterior Chamber Deep and quiet Deep and quiet   Iris Round and reactive Round and reactive   Lens 2+ Nuclear sclerosis Centered posterior chamber intraocular lens   Anterior Vitreous Normal Normal         Fundus Exam       Right Left   Posterior Vitreous Posterior vitreous detachment Clear, vitrectomized   Disc Normal Normal   C/D Ratio 0.6 0.65   Macula Normal Normal   Vessels Normal Normal   Periphery No peripheral retinal holes or tears Retinopexy, retina attached, no new retinal breaks            IMAGING AND PROCEDURES  Imaging and Procedures for 09/09/21  ECHOCARDIOGRAM COMPLETE      Component Value Flag Ref Range Units Status   Area-P 1/2 4.21       cm2 Final   S' Lateral 2.20       cm Final   P 1/2 time 404       msec Final               ECHOCARDIOGRAM REPORT       Patient Name:   [name] [facility] Date of Exam: 09/09/2021 Medical Rec #:  [id number]              Height:       61.7 in Accession #:    [id number]             Weight:       169.0 lb Date of Birth:  Dec 02, 1950              BSA:          1.774 m Patient Age:    40 years               BP:           140/89 mmHg Patient Gender: F                      HR:  81 bpm. Exam Location:  [address]  Procedure: 2D Echo, 3D Echo, Color Doppler, Cardiac Doppler and Strain Analysis  Indications:    Murmur R01.1   History:        Patient has no prior history of Echocardiogram examinations.   Sonographer:    Mikki Santee RDCS Referring Phys: [place]    1. Left ventricular ejection fraction, by estimation, is 60 to 65%. Left ventricular ejection fraction by 3D volume is 63 %. The left ventricle has normal function. The left ventricle has no regional wall motion abnormalities. Left ventricular diastolic  parameters were normal.  2. Right ventricular systolic function  is normal. The right ventricular size is normal. There is mildly elevated pulmonary artery systolic pressure. The estimated right ventricular systolic pressure is 16.0 mmHg.  3. Right atrial size was mildly dilated.  4. The mitral valve is normal in structure. No evidence of mitral valve regurgitation. No evidence of mitral stenosis.  5. The aortic valve was not well visualized. Aortic valve regurgitation is mild. No aortic stenosis is present.  6. The inferior vena cava is normal in size with greater than 50% respiratory variability, suggesting right atrial pressure of 3 mmHg.  FINDINGS  Left Ventricle: Left ventricular ejection fraction, by estimation, is 60 to 65%. Left ventricular ejection fraction by 3D volume is 63 %. The left ventricle has normal function. The left ventricle has no regional wall motion abnormalities. The left  ventricular internal cavity size was normal in size. There is no left ventricular hypertrophy. Left ventricular diastolic parameters were normal.  Right Ventricle: The right ventricular size is normal. No increase in right ventricular wall thickness. Right ventricular systolic function is normal. There is mildly elevated pulmonary artery systolic pressure. The tricuspid regurgitant velocity is 3.11  m/s, and with an assumed right atrial pressure of 3 mmHg, the estimated right ventricular systolic pressure is 73.7 mmHg.  Left Atrium: Left atrial size was normal in size.  Right Atrium: Right atrial size was mildly dilated.  Pericardium: There is no evidence of pericardial effusion.  Mitral Valve: The mitral valve is normal in structure. No evidence of mitral valve regurgitation. No evidence of mitral valve stenosis.  Tricuspid Valve: The tricuspid valve is normal in structure. Tricuspid valve regurgitation is mild.  Aortic Valve: The aortic valve was not well visualized. Aortic valve regurgitation is mild. Aortic regurgitation PHT measures 404 msec. No aortic  stenosis is present.  Pulmonic Valve: The pulmonic valve was not well visualized. Pulmonic valve regurgitation is trivial.  Aorta: The aortic root and ascending aorta are structurally normal, with no evidence of dilitation.  Venous: The inferior vena cava is normal in size with greater than 50% respiratory variability, suggesting right atrial pressure of 3 mmHg.  IAS/Shunts: The interatrial septum was not well visualized.    LEFT VENTRICLE PLAX 2D LVIDd:         3.90 cm         Diastology LVIDs:         2.20 cm         LV e' medial:    9.14 cm/s LV PW:         0.90 cm         LV E/e' medial:  8.6 LV IVS:        0.80 cm         LV e' lateral:   13.50 cm/s LVOT diam:     1.75 cm  LV E/e' lateral: 5.8 LV SV:         70 LV SV Index:   39 LVOT Area:     2.41 cm        3D Volume EF                                LV 3D EF:    Left                                             ventricul                                             ar                                             ejection                                             fraction                                             by 3D                                             volume is                                             63 %.                                  3D Volume EF:                                3D EF:        63 %                                LV EDV:       86 ml                                LV ESV:       31 ml                                LV SV:        54 ml  RIGHT VENTRICLE  RV Basal diam:  3.80 cm RV Mid diam:    3.40 cm RV S prime:     11.90 cm/s TAPSE (M-mode): 2.0 cm  LEFT ATRIUM             Index        RIGHT ATRIUM           Index LA diam:        3.00 cm 1.69 cm/m   RA Area:     21.10 cm LA Vol (A2C):   48.1 ml 27.12 ml/m  RA Volume:   65.90 ml  37.15 ml/m LA Vol (A4C):   30.9 ml 17.42 ml/m LA Biplane Vol: 41.0 ml 23.11 ml/m  AORTIC VALVE LVOT Vmax:   140.00 cm/s LVOT Vmean:  93.400  cm/s LVOT VTI:    0.291 m AI PHT:      404 msec   AORTA Ao Root diam: 2.70 cm Ao Asc diam:  3.50 cm  MITRAL VALVE               TRICUSPID VALVE MV Area (PHT): 4.21 cm    TR Peak grad:   38.7 mmHg MV Decel Time: 180 msec    TR Vmax:        311.00 cm/s MV E velocity: 78.60 cm/s MV A velocity: 83.80 cm/s  SHUNTS MV E/A ratio:  0.94        Systemic VTI:  0.29 m                            Systemic Diam: 1.75 cm  Oswaldo Milian MD Electronically signed by Oswaldo Milian MD Signature Date/Time: 09/09/2021/11:11:56 AM       Final     Linked Images                    OCT, Retina - OU - Both Eyes       Right Eye Quality was good. Scan locations included subfoveal. Central Foveal Thickness: 278. Progression has improved. Findings include normal foveal contour.   Left Eye Quality was good. Scan locations included subfoveal. Central Foveal Thickness: 278. Progression has been stable.   Notes OS with subfoveal pigmentary changes clinically coinciding with irregular findings in the foveal region.  No sign of CNVM OU      Color Fundus Photography Optos - OU - Both Eyes       Right Eye Progression has improved. Disc findings include normal observations. Macula : normal observations. Vessels : normal observations. Periphery : normal observations.   Left Eye Progression has improved. Disc findings include normal observations. Macula : normal observations. Vessels : normal observations.   Notes OS, status post retinal detachment repair left eye,  No new holes or tears.,  Open angle glaucoma, mild stage follow-up with Dr. Ellie Lunch as scheduled                ASSESSMENT/PLAN:  Nuclear sclerotic cataract of right eye The nature of cataract was discussed with the patient as well as the elective nature of surgery. The patient was reassured that surgery at a later date does not put the patient at risk for a worse outcome. It was emphasized that the need for  surgery is dictated by the patient's quality of life as influenced by the cataract. Patient was instructed to maintain close follow up with their general eye care doctor.  Primary open angle glaucoma of both eyes, mild stage As  per Dr. Ellie Lunch  History of retinal detachment History of retinal detachment, repaired via vitrectomy combined with buckle in the past OS doing well.     ICD-10-CM   1. Posterior vitreous detachment of right eye  H43.811 OCT, Retina - OU - Both Eyes    Color Fundus Photography Optos - OU - Both Eyes    2. Nuclear sclerotic cataract of right eye  H25.11     3. Primary open angle glaucoma of both eyes, mild stage  H40.1131     4. History of retinal detachment  Z86.69       1.  OS doing well with a history of retinal detachment, no new findings.  Stable overall  2.  Mild open-angle glaucoma under the care of Dr. Luberta Mutter continue  3.  PVD OD, OD not dilated today but no new findings clinical Optos wide view imaging.  Ophthalmic Meds Ordered this visit:  No orders of the defined types were placed in this encounter.      Return in about 1 year (around 09/10/2022) for DILATE OU, COLOR FP, OCT.  There are no Patient Instructions on file for this visit.   Explained the diagnoses, plan, and follow up with the patient and they expressed understanding.  Patient expressed understanding of the importance of proper follow up care.   Clent Demark Lynnsey Barbara M.D. Diseases & Surgery of the Retina and Vitreous [facility] [place] 09/09/21     Abbreviations: M myopia (nearsighted); A astigmatism; H hyperopia (farsighted); P presbyopia; Mrx spectacle prescription;  CTL contact lenses; OD right eye; OS left eye; OU both eyes  XT exotropia; ET esotropia; PEK punctate epithelial keratitis; PEE punctate epithelial erosions; DES dry eye syndrome; MGD meibomian gland dysfunction; ATs artificial tears; PFAT's preservative free artificial tears; [place] nuclear sclerotic  cataract; PSC posterior subcapsular cataract; ERM epi-retinal membrane; PVD posterior vitreous detachment; RD retinal detachment; DM diabetes mellitus; DR diabetic retinopathy; NPDR non-proliferative diabetic retinopathy; PDR proliferative diabetic retinopathy; CSME clinically significant macular edema; DME diabetic macular edema; dbh dot blot hemorrhages; CWS cotton wool spot; POAG primary open angle glaucoma; C/D cup-to-disc ratio; HVF humphrey visual field; GVF goldmann visual field; OCT optical coherence tomography; IOP intraocular pressure; BRVO Branch retinal vein occlusion; CRVO central retinal vein occlusion; CRAO central retinal artery occlusion; BRAO branch retinal artery occlusion; RT retinal tear; SB scleral buckle; PPV pars plana vitrectomy; VH Vitreous hemorrhage; PRP panretinal laser photocoagulation; IVK intravitreal kenalog; VMT vitreomacular traction; MH Macular hole;  NVD neovascularization of the disc; NVE neovascularization elsewhere; AREDS age related eye disease study; ARMD age related macular degeneration; POAG primary open angle glaucoma; EBMD epithelial/anterior basement membrane dystrophy; ACIOL anterior chamber intraocular lens; IOL intraocular lens; PCIOL posterior chamber intraocular lens; Phaco/IOL phacoemulsification with intraocular lens placement; [place] photorefractive keratectomy; LASIK laser assisted in situ keratomileusis; HTN hypertension; DM diabetes mellitus; COPD chronic obstructive pulmonary disease

## 2021-09-09 NOTE — Assessment & Plan Note (Signed)
As per Dr. Ellie Lunch

## 2021-09-09 NOTE — Assessment & Plan Note (Signed)

## 2021-09-19 DIAGNOSIS — C50911 Malignant neoplasm of unspecified site of right female breast: Secondary | ICD-10-CM | POA: Diagnosis not present

## 2021-11-08 ENCOUNTER — Ambulatory Visit: Payer: Medicare HMO

## 2021-11-11 ENCOUNTER — Ambulatory Visit
Admission: RE | Admit: 2021-11-11 | Discharge: 2021-11-11 | Disposition: A | Discharge status: Home / Self Care | Payer: Medicare HMO | Source: Ambulatory Visit | Attending: Internal Medicine | Admitting: Internal Medicine

## 2021-11-11 DIAGNOSIS — Z1231 Encounter for screening mammogram for malignant neoplasm of breast: Secondary | ICD-10-CM

## 2022-01-14 DIAGNOSIS — H40011 Open angle with borderline findings, low risk, right eye: Secondary | ICD-10-CM | POA: Diagnosis not present

## 2022-02-04 ENCOUNTER — Ambulatory Visit (INDEPENDENT_AMBULATORY_CARE_PROVIDER_SITE_OTHER): Payer: Medicare HMO

## 2022-02-04 ENCOUNTER — Other Ambulatory Visit: Payer: Self-pay | Admitting: Obstetrics & Gynecology

## 2022-02-04 DIAGNOSIS — Z13820 Encounter for screening for osteoporosis: Secondary | ICD-10-CM

## 2022-02-04 DIAGNOSIS — M810 Age-related osteoporosis without current pathological fracture: Secondary | ICD-10-CM

## 2022-02-04 DIAGNOSIS — M816 Localized osteoporosis [Lequesne]: Secondary | ICD-10-CM

## 2022-02-04 DIAGNOSIS — Z780 Asymptomatic menopausal state: Secondary | ICD-10-CM | POA: Diagnosis not present

## 2022-05-13 ENCOUNTER — Telehealth: Payer: Self-pay | Admitting: Gastroenterology

## 2022-05-13 NOTE — Telephone Encounter (Signed)
It looks like her last colonoscopy was in 05/2012 with Dr. Matthias Hughs - if she is being referred for a screening colonoscopy can direct book it in the [facility] with me. If she wants an office visit first to discuss other issues I am happy to see her in the office otherwise. thanks

## 2022-05-13 NOTE — Telephone Encounter (Signed)
Hi Dr. Adela Lank,   We received a referral for patient to have a colonoscopy. She does have GI history with [facility] and is not happy over there. She is specifically asking for you to continue her GI care if possible. Her records were obtained and scanned into Media for you to review and advise on scheduling.   Thanks

## 2022-05-14 NOTE — Telephone Encounter (Signed)
Patient wished to be schedule in the OV first to dicuss colonoscopy, was scheduled for 7/10 at 10:30 AM to discuss colonoscopy.

## 2022-06-17 DIAGNOSIS — R198 Other specified symptoms and signs involving the digestive system and abdomen: Secondary | ICD-10-CM | POA: Diagnosis not present

## 2022-06-17 DIAGNOSIS — K5900 Constipation, unspecified: Secondary | ICD-10-CM | POA: Diagnosis not present

## 2022-06-19 ENCOUNTER — Encounter: Payer: Self-pay | Admitting: Obstetrics & Gynecology

## 2022-06-19 ENCOUNTER — Ambulatory Visit (INDEPENDENT_AMBULATORY_CARE_PROVIDER_SITE_OTHER): Payer: Medicare HMO | Admitting: Obstetrics & Gynecology

## 2022-06-19 VITALS — BP 120/82 | HR 75 | Ht 61.75 in

## 2022-06-19 DIAGNOSIS — Z90710 Acquired absence of both cervix and uterus: Secondary | ICD-10-CM

## 2022-06-19 DIAGNOSIS — N904 Leukoplakia of vulva: Secondary | ICD-10-CM

## 2022-06-19 DIAGNOSIS — Z780 Asymptomatic menopausal state: Secondary | ICD-10-CM

## 2022-06-19 MED ORDER — CLOBETASOL PROPIONATE 0.05 % EX OINT: 1.0000 | TOPICAL_OINTMENT | CUTANEOUS | 4 refills | Status: DC

## 2022-06-19 NOTE — Progress Notes (Signed)
Norma Mayer 12-15-1950 [id number]   History:    72 y.o.  G0 Divorced.   RP: F/U management of Lichen Sclerosus of the Vulva   HPI: Lichen Sclerosus of the vulva, controled on Clobetasol ointment twice a week.  At times feels mild vulvar irritation for which she increased the Clobetasol applications.   S/P Total Hysterectomy/BSO.  Postmenopause, well on no HRT.  No pelvic pain. Abstinent. Pap Neg 03/2019.  Remote h/o Cervical Dysplasia post CryoRx in her 81's. Paps normal since then. H/O Rt Breast Ca. Breasts normal.  Mammo 10/2021 Neg.  BD Osteoporosis at Lt 1/3 Forearm -2.7 in 01/2022, stable at all sites. Urine/BMs normal.  BMI 31.16. Health labs with Fam MD.  Alen Bleacher 2014.   Past medical history,surgical history, family history and social history were all reviewed and documented in the EPIC chart.  Gynecologic History No LMP recorded. Patient has had a hysterectomy.  Obstetric History OB History  Gravida Para Term Preterm AB Living  0            SAB IAB Ectopic Multiple Live Births                ROS: A ROS was performed and pertinent positives and negatives are included in the history. GENERAL: No fevers or chills. HEENT: No change in vision, no earache, sore throat or sinus congestion. NECK: No pain or stiffness. CARDIOVASCULAR: No chest pain or pressure. No palpitations. PULMONARY: No shortness of breath, cough or wheeze. GASTROINTESTINAL: No abdominal pain, nausea, vomiting or diarrhea, melena or bright red blood per rectum. GENITOURINARY: No urinary frequency, urgency, hesitancy or dysuria. MUSCULOSKELETAL: No joint or muscle pain, no back pain, no recent trauma. DERMATOLOGIC: No rash, no itching, no lesions. ENDOCRINE: No polyuria, polydipsia, no heat or cold intolerance. No recent change in weight. HEMATOLOGICAL: No anemia or easy bruising or bleeding. NEUROLOGIC: No headache, seizures, numbness, tingling or weakness. PSYCHIATRIC: No depression, no loss of interest in  normal activity or change in sleep pattern.     Exam:   BP 120/82   Pulse 75   Ht 5' 1.75" (1.568 m)   SpO2 97%   BMI 31.16 kg/m   Body mass index is 31.16 kg/m.  General appearance : Well developed well nourished female. No acute distress HEENT: Eyes: no retinal hemorrhage or exudates,  Neck supple, trachea midline, no carotid bruits, no thyroidmegaly Lungs: Clear to auscultation, no rhonchi or wheezes, or rib retractions  Heart: Regular rate and rhythm, no murmurs or gallops Breast:Examined in sitting and supine position were symmetrical in appearance, no palpable masses or tenderness,  no skin retraction, no nipple inversion, no nipple discharge, no skin discoloration, no axillary or supraclavicular lymphadenopathy Abdomen: no palpable masses or tenderness, no rebound or guarding Extremities: no edema or skin discoloration or tenderness  Pelvic: Vulva: Normal             Vagina: No gross lesions or discharge  Cervix/Uterus absent  Adnexa  Without masses or tenderness  Anus: Normal   Assessment/Plan:  72 y.o. female   1. 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  At times feels mild vulvar irritation for which she increased the Clobetasol applications. No CI to continue on Clobetasol, prescription sent to pharmacy.  2. Postmenopause S/P Total Hysterectomy/BSO.  Postmenopause, well on no HRT.  No pelvic pain. Abstinent. Pap Neg 03/2019.  Remote h/o Cervical Dysplasia post CryoRx in her  20's. Paps normal since then. H/O Rt Breast Ca. Breasts normal.  Mammo 10/2021 Neg.  BD Osteoporosis at Lt 1/3 Forearm -2.7 in 01/2022, stable at all sites. Urine/BMs normal. BMI 31.16. Health labs with Fam MD.  [place] 2014.  3. S/P total hysterectomy/BSO  Other orders - clobetasol ointment (TEMOVATE) 0.05 %; Apply 1 Application topically 2 (two) times a week.   Genia Del MD, 9:15 AM

## 2022-07-03 ENCOUNTER — Other Ambulatory Visit: Payer: Self-pay | Admitting: Internal Medicine

## 2022-07-03 DIAGNOSIS — K5900 Constipation, unspecified: Secondary | ICD-10-CM

## 2022-07-03 DIAGNOSIS — R198 Other specified symptoms and signs involving the digestive system and abdomen: Secondary | ICD-10-CM

## 2022-07-04 ENCOUNTER — Ambulatory Visit
Admission: RE | Admit: 2022-07-04 | Discharge: 2022-07-04 | Disposition: A | Discharge status: Home / Self Care | Payer: Medicare HMO | Source: Ambulatory Visit | Attending: Internal Medicine | Admitting: Internal Medicine

## 2022-07-04 DIAGNOSIS — N289 Disorder of kidney and ureter, unspecified: Secondary | ICD-10-CM | POA: Diagnosis not present

## 2022-07-04 DIAGNOSIS — K5900 Constipation, unspecified: Secondary | ICD-10-CM

## 2022-07-04 DIAGNOSIS — R198 Other specified symptoms and signs involving the digestive system and abdomen: Secondary | ICD-10-CM

## 2022-07-04 MED ORDER — IOPAMIDOL (ISOVUE-300) INJECTION 61%
100.0000 mL | Freq: Once | INTRAVENOUS | Status: AC | PRN
  Administered 2022-07-04: 100 mL via INTRAVENOUS

## 2022-07-08 ENCOUNTER — Encounter: Payer: Self-pay | Admitting: Internal Medicine

## 2022-07-08 ENCOUNTER — Other Ambulatory Visit: Payer: Self-pay | Admitting: Internal Medicine

## 2022-07-08 DIAGNOSIS — N281 Cyst of kidney, acquired: Secondary | ICD-10-CM

## 2022-07-10 ENCOUNTER — Ambulatory Visit
Admission: RE | Admit: 2022-07-10 | Discharge: 2022-07-10 | Disposition: A | Discharge status: Home / Self Care | Payer: Medicare HMO | Source: Ambulatory Visit | Attending: Internal Medicine | Admitting: Internal Medicine

## 2022-07-10 DIAGNOSIS — N281 Cyst of kidney, acquired: Secondary | ICD-10-CM

## 2022-07-10 DIAGNOSIS — N2889 Other specified disorders of kidney and ureter: Secondary | ICD-10-CM | POA: Diagnosis not present

## 2022-07-16 ENCOUNTER — Other Ambulatory Visit: Payer: Self-pay | Admitting: Internal Medicine

## 2022-07-16 DIAGNOSIS — D4101 Neoplasm of uncertain behavior of right kidney: Secondary | ICD-10-CM

## 2022-07-21 NOTE — Progress Notes (Deleted)
07/21/2022 Norma Mayer [id number] 05-15-1950  Referring provider: Chilton Greathouse, MD Primary GI doctor: Dr. Adela Lank  ASSESSMENT AND PLAN:   There are no diagnoses linked to this encounter.   Patient Care Team: Chilton Greathouse, MD as PCP - General (Internal Medicine) Genia Del, MD as Consulting Physician (Obstetrics and Gynecology)  HISTORY OF PRESENT ILLNESS: 72 y.o. female with a past medical history of infiltrating ductal breast carcinoma 2014 and others listed below presents for evaluation of abdominal pain, discussed colonoscopy.   Patient previously saw Dr. Matthias Hughs at [facility], contacted our office 05/13/2022 for wanting to transfer care. 03/2012 colonoscopy with Dr. Matthias Hughs. 07/04/2022 CT abdomen pelvis with contrast for constipation and abdominal pain showed normal liver, no gallstones, normal pancreas normal spleen, normal stomach, no bowel wall thickening, did show 1 cm abnormality arising posteriorly from right kidney hyperdense cyst recommending renal ultrasound 07/10/2022 renal ultrasound showed 9 mm mass solid mass is not excluded, pending MRI abdomen with and without contrast  Patient {Actions; denies-reports:120008} family history of colon cancer or other gastrointestinal malignancies. She {Actions; denies-reports:120008} blood thinner use.  She {Actions; denies-reports:120008} NSAID use.  She {Actions; denies-reports:120008} ETOH use.   She {Actions; denies-reports:120008} tobacco use.  She {Actions; denies-reports:120008} drug use.    She  reports that she has quit smoking. She has never used smokeless tobacco. She reports current alcohol use. She reports that she does not use drugs.  RELEVANT LABS AND IMAGING: CBC    Component Value Date/Time   WBC 4.6 03/08/2010 1555   WBC 5.5 05/22/2008 0955   RBC 4.19 03/08/2010 1555   RBC 4.61 05/22/2008 0955   HGB 13.5 03/08/2010 1555   HCT 39.2 03/08/2010 1555   PLT 204 03/08/2010  1555   MCV 93.5 03/08/2010 1555   MCH 32.1 03/08/2010 1555   MCHC 34.3 03/08/2010 1555   MCHC 34.8 05/22/2008 0955   RDW 13.3 03/08/2010 1555   LYMPHSABS 1.2 03/08/2010 1555   MONOABS 0.6 03/08/2010 1555   EOSABS 0.1 03/08/2010 1555   BASOSABS 0.0 03/08/2010 1555   No results for input(s): "HGB" in the last 8760 hours.  CMP     Component Value Date/Time   NA 136 03/08/2010 1555   K 3.8 03/08/2010 1555   CL 102 03/08/2010 1555   CO2 30 03/08/2010 1555   GLUCOSE 91 03/08/2010 1555   BUN 15 03/08/2010 1555   CREATININE 0.86 03/08/2010 1555   CALCIUM 9.0 03/08/2010 1555   PROT 6.5 03/08/2010 1555   ALBUMIN 3.8 03/08/2010 1555   AST 23 03/08/2010 1555   ALT 10 03/08/2010 1555   ALKPHOS 49 03/08/2010 1555   BILITOT 0.7 03/08/2010 1555   GFRNONAA >60 05/22/2008 0955   GFRAA  05/22/2008 0955    >60        The eGFR has been calculated using the MDRD equation. This calculation has not been validated in all clinical situations. eGFR's persistently <60 mL/min signify possible Chronic Kidney Disease.      Latest Ref Rng & Units 03/08/2010    3:55 PM 09/18/2009    2:10 PM 09/12/2008    3:55 PM  Hepatic Function  Total Protein 6.0 - 8.3 g/dL 6.5  6.1  6.7   Albumin 3.5 - 5.2 g/dL 3.8  4.0  3.8   AST 0 - 37 U/L 23  16  21    ALT 0 - 35 U/L 10  <8  11   Alk Phosphatase 39 - 117 U/L 49  55  59   Total Bilirubin 0.3 - 1.2 mg/dL 0.7  0.4  0.3       Current Medications:        Current Outpatient Medications (Other):    CALCIUM PO, Take by mouth.   Cholecalciferol (VITAMIN D PO), Take by mouth.   clobetasol ointment (TEMOVATE) 0.05 %, Apply 1 Application topically 2 (two) times a week.  Medical History:  Past Medical History:  Diagnosis Date   Breast cancer (HCC) 2006   Right   Cancer [facility]) 2006   BREAST CANCER/infiltrating ductal, ER/PR positive   Osteopenia 03/2015   T score -2.4 FRAX 7.5%/0.5%  stable from prior DEXA   Retinal detachment    Allergies:   Allergies  Allergen Reactions   Combigan [Brimonidine Tartrate-Timolol]    Dorzolamide    Ketoconazole Nausea And Vomiting    headache   Mydriacyl [Tropicamide]    Other Itching    ADHESIVE TAPE:redness   Timolol    Belladonna Alkaloids Palpitations    headache     Surgical History:  She  has a past surgical history that includes Tonsillectomy (1958); Eye surgery (2009/2010); Breast biopsy (08/1999); Retinal detachment surgery (2009-2010); Abdominal hysterectomy (2003); Oophorectomy; and Breast lumpectomy (Right, 2006). Family History:  Her family history includes Cancer in her mother; Heart disease in her mother; Hypertension in her father.  REVIEW OF SYSTEMS  : All other systems reviewed and negative except where noted in the History of Present Illness.  PHYSICAL EXAM: There were no vitals taken for this visit. General Appearance: Well nourished, in no apparent distress. Head:   Normocephalic and atraumatic. Eyes:  sclerae anicteric,conjunctive pink  Respiratory: Respiratory effort normal, BS equal bilaterally without rales, rhonchi, wheezing. Cardio: RRR with no MRGs. Peripheral pulses intact.  Abdomen: Soft,  {BlankSingle:19197::"Flat","Obese","Non-distended"} ,active bowel sounds. {actendernessAB:27319} tenderness {anatomy; site abdomen:5010}. {BlankMultiple:19196::"Without guarding","With guarding","Without rebound","With rebound"}. No masses. Rectal: {acrectalexam:27461} Musculoskeletal: Full ROM, {PSY - GAIT AND STATION:22860} gait. {With/Without:304960234} edema. Skin:  Dry and intact without significant lesions or rashes Neuro: Alert and  oriented x4;  No focal deficits. Psych:  Cooperative. Normal mood and affect.    Doree Albee, PA-C 3:02 PM

## 2022-07-22 DIAGNOSIS — H40012 Open angle with borderline findings, low risk, left eye: Secondary | ICD-10-CM | POA: Diagnosis not present

## 2022-07-22 DIAGNOSIS — H52203 Unspecified astigmatism, bilateral: Secondary | ICD-10-CM | POA: Diagnosis not present

## 2022-07-22 DIAGNOSIS — H25811 Combined forms of age-related cataract, right eye: Secondary | ICD-10-CM | POA: Diagnosis not present

## 2022-07-23 ENCOUNTER — Ambulatory Visit: Payer: Medicare HMO | Admitting: Physician Assistant

## 2022-07-30 ENCOUNTER — Encounter: Payer: Self-pay | Admitting: Internal Medicine

## 2022-07-30 NOTE — Progress Notes (Signed)
Chief Complaint: screening colonoscopy and LLQ "sensation" Primary GI MD: Requests Dr. Adela Lank  HPI: 72 year old female history of breast cancer s/p lumpectomy, open-angle glaucoma, osteopenia, presents for evaluation of repeat screening colonoscopy and LLQ "sensation"  Echocardiogram 08/2021 with ejection fraction 60 to 65%  Patient reports over the last few months that she has had an intermittent LLQ "sensation" that occurs.  Not associated with eating.  Not associated with bowel movements.  Does not quite feel like a pain but feels like a "hot sensation" and this really concerns her.  Her primary care told her to increase water and add fiber and this has provided mild relief.  Denies melena/hematochezia.  Denies weight loss.  CT abdomen pelvis with contrast 07/04/2022 done for constipation showed 1 cm dense abnormality on the right kidney.  She is currently scheduled for MRI for further evaluation.  No gallstones, normal gallbladder.  No liver abnormality.  No evidence of bowel wall thickening.  Reports hoarseness in the morning that she attributes to allergies.  Last colonoscopy in 2014, normal.   PREVIOUS GI WORKUP   Colonoscopy 05/2012 with Dr. Matthias Hughs: Normal, repeat 10 years  Past Medical History:  Diagnosis Date   Breast cancer (HCC) 2006   Right   Cancer [facility]) 2006   BREAST CANCER/infiltrating ductal, ER/PR positive   Osteopenia 03/2015   T score -2.4 FRAX 7.5%/0.5%  stable from prior DEXA   Retinal detachment     Past Surgical History:  Procedure Laterality Date   ABDOMINAL HYSTERECTOMY  2003   TAH,BSO mucinous cystadenoma/leiomyoma/endosalpingosis   BREAST BIOPSY  08/1999   X 2 BREAST BX--ATYPICAL DUCTAL HYPERPLASIA   BREAST LUMPECTOMY Right 2006   BREAST CANCER 2006   EYE SURGERY  2009/2010   RETINA EYE SURGERY   OOPHORECTOMY     BSO   RETINAL DETACHMENT SURGERY  2009-2010   TONSILLECTOMY  1958    Current Outpatient Medications  Medication Sig Dispense  Refill   CALCIUM PO Take by mouth.     Cholecalciferol (VITAMIN D PO) Take by mouth.     clobetasol ointment (TEMOVATE) 0.05 % Apply 1 Application topically 2 (two) times a week. 30 g 4   No current facility-administered medications for this visit.    Allergies as of 07/31/2022 - Review Complete 06/19/2022  Allergen Reaction Noted   Combigan [brimonidine tartrate-timolol]  09/05/2019   Dorzolamide  09/05/2019   Ketoconazole Nausea And Vomiting 06/14/2010   Mydriacyl [tropicamide]  09/05/2019   Other Itching 07/05/2010   Timolol  09/05/2019   Belladonna alkaloids Palpitations 06/14/2010    Family History  Problem Relation Age of Onset   Hypertension Father    Cancer Mother        UTERINE   Heart disease Mother    Breast cancer Neg Hx     Social History   Socioeconomic History   Marital status: Divorced    Spouse name: Not on file   Number of children: Not on file   Years of education: Not on file   Highest education level: Not on file  Occupational History   Not on file  Tobacco Use   Smoking status: Former   Smokeless tobacco: Never  Vaping Use   Vaping status: Never Used  Substance and Sexual Activity   Alcohol use: Yes    Comment: occ   Drug use: No   Sexual activity: Not Currently    Birth control/protection: Surgical    Comment: 1st intercourse 72 yo-Fewer than 5 partners, hysterectomy  Other Topics Concern   Not on file  Social History Narrative   Not on file   Social Determinants of Health   Financial Resource Strain: Not on file  Food Insecurity: Not on file  Transportation Needs: Not on file  Physical Activity: Not on file  Stress: Not on file  Social Connections: Not on file  Intimate Partner Violence: Not on file    Review of Systems:    Constitutional: No weight loss, fever, chills, weakness or fatigue HEENT: Eyes: No change in vision               Ears, Nose, Throat:  No change in hearing or congestion Skin: No rash or  itching Cardiovascular: No chest pain, chest pressure or palpitations   Respiratory: No SOB or cough Gastrointestinal: See HPI and otherwise negative Genitourinary: No dysuria or change in urinary frequency Neurological: No headache, dizziness or syncope Musculoskeletal: No new muscle or joint pain Hematologic: No bleeding or bruising Psychiatric: No history of depression or anxiety    Physical Exam:  Vital signs: There were no vitals taken for this visit.  Constitutional: NAD, Well developed, Well nourished, alert and cooperative Head:  Normocephalic and atraumatic. Eyes:   PEERL, EOMI. No icterus. Conjunctiva pink. Respiratory: Respirations even and unlabored. Lungs clear to auscultation bilaterally.   No wheezes, crackles, or rhonchi.  Cardiovascular:  Regular rate and rhythm. No peripheral edema, cyanosis or pallor.  Gastrointestinal:  Soft, nondistended, nontender. No rebound or guarding. Normal bowel sounds. No appreciable masses or hepatomegaly. Rectal:  Not performed.  Msk:  Symmetrical without gross deformities. Without edema, no deformity or joint abnormality.  Neurologic:  Alert and  oriented x4;  grossly normal neurologically.  Skin:   Dry and intact without significant lesions or rashes. Psychiatric: Oriented to person, place and time. Demonstrates good judgement and reason without abnormal affect or behaviors.   RELEVANT LABS AND IMAGING: CBC    Component Value Date/Time   WBC 4.6 03/08/2010 1555   WBC 5.5 05/22/2008 0955   RBC 4.19 03/08/2010 1555   RBC 4.61 05/22/2008 0955   HGB 13.5 03/08/2010 1555   HCT 39.2 03/08/2010 1555   PLT 204 03/08/2010 1555   MCV 93.5 03/08/2010 1555   MCH 32.1 03/08/2010 1555   MCHC 34.3 03/08/2010 1555   MCHC 34.8 05/22/2008 0955   RDW 13.3 03/08/2010 1555   LYMPHSABS 1.2 03/08/2010 1555   MONOABS 0.6 03/08/2010 1555   EOSABS 0.1 03/08/2010 1555   BASOSABS 0.0 03/08/2010 1555    CMP     Component Value Date/Time   NA  136 03/08/2010 1555   K 3.8 03/08/2010 1555   CL 102 03/08/2010 1555   CO2 30 03/08/2010 1555   GLUCOSE 91 03/08/2010 1555   BUN 15 03/08/2010 1555   CREATININE 0.86 03/08/2010 1555   CALCIUM 9.0 03/08/2010 1555   PROT 6.5 03/08/2010 1555   ALBUMIN 3.8 03/08/2010 1555   AST 23 03/08/2010 1555   ALT 10 03/08/2010 1555   ALKPHOS 49 03/08/2010 1555   BILITOT 0.7 03/08/2010 1555   GFRNONAA >60 05/22/2008 0955   GFRAA  05/22/2008 0955    >60        The eGFR has been calculated using the MDRD equation. This calculation has not been validated in all clinical situations. eGFR's persistently <60 mL/min signify possible Chronic Kidney Disease.     Assessment/Plan:   LLQ pain Other constipation Suspect her LLQ sensation/discomfort is associated with constipation especially since it  is improving with fiber and water intake.  However, cannot rule out obstructive polyp causing her constipation.  Mother with history of constipation -  Benefiber/metamucil 1 tablespoon daily. Adjust dose based on response -  strive for 60-100oz of water per day - if no improvement with fiber, add in Miralax daily. Start taking 1 capful Miralax (17 grams) 1x / day for 1 week.   If this is not effective, increase to 1 dose 2x / day for 1 week.   If this is still not effective, increase to two capfuls (34 grams) 2x / day.   Can adjust dose as needed based on response. Can take 1/2 cap daily, skip days, or increase per day - Can do trial of Ibgard (over the counter) for abdominal pain -Colonoscopy for further evaluation - follow up per procedure  Special screening for malignant neoplasms, colon -Due for repeat March 2024 - I thoroughly discussed the procedure with the patient (at bedside) to include nature of the procedure, alternatives, benefits, and risks (including but not limited to bleeding, infection, perforation, anesthesia/cardiac pulmonary complications).  Patient verbalized understanding and gave  verbal consent to proceed with procedure.   Lara Mulch [facility] 07/30/2022, 3:33 PM  Cc: Chilton Greathouse, MD

## 2022-07-31 ENCOUNTER — Encounter: Payer: Self-pay | Admitting: Gastroenterology

## 2022-07-31 ENCOUNTER — Ambulatory Visit: Payer: Medicare HMO | Admitting: Gastroenterology

## 2022-07-31 VITALS — BP 124/82 | HR 72 | Ht 61.75 in | Wt 170.4 lb

## 2022-07-31 DIAGNOSIS — Z1211 Encounter for screening for malignant neoplasm of colon: Secondary | ICD-10-CM

## 2022-07-31 DIAGNOSIS — R1032 Left lower quadrant pain: Secondary | ICD-10-CM | POA: Diagnosis not present

## 2022-07-31 DIAGNOSIS — K5909 Other constipation: Secondary | ICD-10-CM | POA: Diagnosis not present

## 2022-07-31 MED ORDER — NA SULFATE-K SULFATE-MG SULF 17.5-3.13-1.6 GM/177ML PO SOLN: 1.0000 | Freq: Once | ORAL | 0 refills | Status: AC

## 2022-07-31 NOTE — Progress Notes (Signed)
Agree with assessment and plan as outlined.  

## 2022-07-31 NOTE — Patient Instructions (Addendum)
You have been scheduled for a colonoscopy. Please follow written instructions given to you at your visit today.   Please pick up your prep supplies at the pharmacy within the next 1-3 days.  If you use inhalers (even only as needed), please bring them with you on the day of your procedure.  DO NOT TAKE 7 DAYS PRIOR TO TEST- Trulicity (dulaglutide) Ozempic, Wegovy (semaglutide) Mounjaro (tirzepatide) Bydureon Bcise (exanatide extended release)  DO NOT TAKE 1 DAY PRIOR TO YOUR TEST Rybelsus (semaglutide) Adlyxin (lixisenatide) Victoza (liraglutide) Byetta (exanatide) ___________________________________________________________________________  Follow up per recommendations after your colonoscopy     Use A Squatty Potty  Take Miralax as needed daily  Take Fiber daily  Due to recent changes in healthcare laws, you may see the results of your imaging and laboratory studies on MyChart before your provider has had a chance to review them.  We understand that in some cases there may be results that are confusing or concerning to you. Not all laboratory results come back in the same time frame and the provider may be waiting for multiple results in order to interpret others.  Please give [place] 48 hours in order for your provider to thoroughly review all the results before contacting the office for clarification of your results.    _______________________________________________________  If your blood pressure at your visit was 140/90 or greater, please contact your primary care physician to follow up on this.  _______________________________________________________  If you are age 65 or older, your body mass index should be between 23-30. Your Body mass index is 31.41 kg/m. If this is out of the aforementioned range listed, please consider follow up with your Primary Care Provider.  If you are age 16 or younger, your body mass index should be between 19-25. Your Body mass index is 31.41  kg/m. If this is out of the aformentioned range listed, please consider follow up with your Primary Care Provider.   ________________________________________________________  The [facility] providers would like to encourage you to use [facility] to communicate with providers for non-urgent requests or questions.  Due to long hold times on the telephone, sending your provider a message by [facility] may be a faster and more efficient way to get a response.  Please allow 48 business hours for a response.  Please remember that this is for non-urgent requests.  _______________________________________________________   I appreciate the  opportunity to care for you  Thank You   [facility]

## 2022-08-07 ENCOUNTER — Ambulatory Visit (AMBULATORY_SURGERY_CENTER): Payer: Medicare HMO | Admitting: Gastroenterology

## 2022-08-07 ENCOUNTER — Encounter: Payer: Self-pay | Admitting: Gastroenterology

## 2022-08-07 VITALS — BP 114/64 | HR 67 | Temp 98.4°F | Resp 14 | Ht 61.75 in | Wt 170.0 lb

## 2022-08-07 DIAGNOSIS — D125 Benign neoplasm of sigmoid colon: Secondary | ICD-10-CM

## 2022-08-07 DIAGNOSIS — D122 Benign neoplasm of ascending colon: Secondary | ICD-10-CM

## 2022-08-07 DIAGNOSIS — Z1211 Encounter for screening for malignant neoplasm of colon: Secondary | ICD-10-CM

## 2022-08-07 DIAGNOSIS — K635 Polyp of colon: Secondary | ICD-10-CM | POA: Diagnosis not present

## 2022-08-07 DIAGNOSIS — R1032 Left lower quadrant pain: Secondary | ICD-10-CM

## 2022-08-07 MED ORDER — SODIUM CHLORIDE 0.9 % IV SOLN
500.0000 mL | Freq: Once | INTRAVENOUS | Status: DC
Start: 2022-08-07 — End: 2022-08-07

## 2022-08-07 NOTE — Progress Notes (Signed)
Called to room to assist during endoscopic procedure.  Patient ID and intended procedure confirmed with present staff. Received instructions for my participation in the procedure from the performing physician.  

## 2022-08-07 NOTE — Progress Notes (Signed)
[facility] History and Physical   Primary Care Physician:  Chilton Greathouse, MD   Reason for Procedure:   Colon cancer screening, LLQ pain  Plan:    colonoscopy     HPI: Norma Mayer is a 72 y.o. female  here for colonoscopy - last exam 2014 was normal. She has been having some pain in the LLQ - see note from 07/31/22 for details. CT scan done for pain did not show clear cause.   Patient denies any bowel symptoms at this time. No family history of colon cancer known. Otherwise feels well without any cardiopulmonary symptoms.   I have discussed risks / benefits of anesthesia and endoscopic procedure with Danielle Rankin and they wish to proceed with the exams as outlined today.    Past Medical History:  Diagnosis Date   Arthritis    Breast cancer (HCC) 2006   Right   Cancer [facility]) 2006   BREAST CANCER/infiltrating ductal, ER/PR positive   Cataract    Osteopenia 03/2015   T score -2.4 FRAX 7.5%/0.5%  stable from prior DEXA   Retinal detachment    with lens left eye    Past Surgical History:  Procedure Laterality Date   ABDOMINAL HYSTERECTOMY  2003   TAH,BSO mucinous cystadenoma/leiomyoma/endosalpingosis   BREAST BIOPSY  08/1999   X 2 BREAST BX--ATYPICAL DUCTAL HYPERPLASIA   BREAST LUMPECTOMY Right 2006   BREAST CANCER 2006   COLONOSCOPY     EYE SURGERY  2009/2010   RETINA EYE SURGERY   OOPHORECTOMY     BSO   RETINAL DETACHMENT SURGERY  2009-2010   x5   TONSILLECTOMY  1958    Prior to Admission medications   Medication Sig Start Date End Date Taking? Authorizing Provider  CALCIUM PO Take by mouth.   Yes [provider]  Cholecalciferol (VITAMIN D PO) Take by mouth.   Yes [provider]  clobetasol ointment (TEMOVATE) 0.05 % Apply 1 Application topically 2 (two) times a week. 06/19/22  Yes Genia Del, MD    Current Outpatient Medications  Medication Sig Dispense Refill   CALCIUM PO Take by mouth.      Cholecalciferol (VITAMIN D PO) Take by mouth.     clobetasol ointment (TEMOVATE) 0.05 % Apply 1 Application topically 2 (two) times a week. 30 g 4   Current Facility-Administered Medications  Medication Dose Route Frequency Provider Last Rate Last Admin   0.9 %  sodium chloride infusion  500 mL Intravenous Once Daizha Anand, Willaim Rayas, MD        Allergies as of 08/07/2022 - Review Complete 08/07/2022  Allergen Reaction Noted   Combigan [brimonidine tartrate-timolol]  09/05/2019   Dorzolamide  09/05/2019   Ketoconazole Nausea And Vomiting 06/14/2010   Mydriacyl [tropicamide]  09/05/2019   Other Itching 07/05/2010   Timolol  09/05/2019   Belladonna alkaloids Palpitations 06/14/2010    Family History  Problem Relation Age of Onset   Cancer Mother        UTERINE   Heart disease Mother    Hypertension Father    Breast cancer Neg Hx    Colon cancer Neg Hx    Esophageal cancer Neg Hx    Rectal cancer Neg Hx    Stomach cancer Neg Hx     Social History   Socioeconomic History   Marital status: Divorced    Spouse name: Not on file   Number of children: Not on file   Years of education: Not on file  Highest education level: Not on file  Occupational History   Not on file  Tobacco Use   Smoking status: Former   Smokeless tobacco: Never  Vaping Use   Vaping status: Never Used  Substance and Sexual Activity   Alcohol use: Yes    Comment: occ   Drug use: No   Sexual activity: Not Currently    Birth control/protection: Surgical    Comment: 1st intercourse 72 yo-Fewer than 5 partners, hysterectomy  Other Topics Concern   Not on file  Social History Narrative   Not on file   Social Determinants of Health   Financial Resource Strain: Not on file  Food Insecurity: Not on file  Transportation Needs: Not on file  Physical Activity: Not on file  Stress: Not on file  Social Connections: Not on file  Intimate Partner Violence: Not on file    Review of Systems: All other  review of systems negative except as mentioned in the HPI.  Physical Exam: Vital signs BP (!) 142/84   Pulse 87   Temp 98.4 F (36.9 C)   Ht 5' 1.75" (1.568 m)   Wt 170 lb (77.1 kg)   SpO2 100%   BMI 31.35 kg/m   General:   Alert,  Well-developed, pleasant and cooperative in NAD Lungs:  Clear throughout to auscultation.   Heart:  Regular rate and rhythm Abdomen:  Soft, nontender and nondistended.   Neuro/Psych:  Alert and cooperative. Normal mood and affect. A and O x 3  Harlin Rain, MD [facility]

## 2022-08-07 NOTE — Progress Notes (Signed)
Uneventful anesthetic. Report to pacu rn. Vss. Care resumed by rn. 

## 2022-08-07 NOTE — Op Note (Signed)
[facility] Patient Name: Norma Mayer Procedure Date: 08/07/2022 2:28 PM MRN: [id number] Endoscopist: Viviann Spare P. Adela Lank , MD, [id number] Age: 72 Referring MD:  Date of Birth: 02-11-1950 Gender: Female Account #: [id number] Procedure:                Colonoscopy Indications:              Screening for colorectal malignant neoplasm.                            Incidental - LLQ pain - negative CT scan for cause                            of symptoms Medicines:                Monitored Anesthesia Care Procedure:                Pre-Anesthesia Assessment:                           - Prior to the procedure, a History and Physical                            was performed, and patient medications and                            allergies were reviewed. The patient's tolerance of                            previous anesthesia was also reviewed. The risks                            and benefits of the procedure and the sedation                            options and risks were discussed with the patient.                            All questions were answered, and informed consent                            was obtained. Prior Anticoagulants: The patient has                            taken no anticoagulant or antiplatelet agents. ASA                            Grade Assessment: II - A patient with mild systemic                            disease. After reviewing the risks and benefits,                            the patient was deemed in satisfactory condition to  undergo the procedure.                           After obtaining informed consent, the colonoscope                            was passed under direct vision. Throughout the                            procedure, the patient's blood pressure, pulse, and                            oxygen saturations were monitored continuously. The                            [id number]-HQ190L Colonoscope [id number] was  introduced                            through the anus and advanced to the the terminal                            ileum, with identification of the appendiceal                            orifice and IC valve. The colonoscopy was performed                            without difficulty. The patient tolerated the                            procedure well. The quality of the bowel                            preparation was good. The terminal ileum, ileocecal                            valve, appendiceal orifice, and rectum were                            photographed. Scope In: 2:34:18 PM Scope Out: 2:55:43 PM Scope Withdrawal Time: 0 hours 14 minutes 11 seconds  Total Procedure Duration: 0 hours 21 minutes 25 seconds  Findings:                 The perianal and digital rectal examinations were                            normal.                           The terminal ileum appeared normal.                           A diminutive polyp was found in the ascending  colon. The polyp was sessile. The polyp was removed                            with a cold snare. Resection and retrieval were                            complete.                           Two sessile polyps were found in the sigmoid colon.                            The polyps were 2 to 4 mm in size. These polyps                            were removed with a cold snare. Resection and                            retrieval were complete.                           The colon was tortuous, with some restricted                            mobility of the left colon.                           Internal hemorrhoids were found during                            retroflexion. The hemorrhoids were small.                           Anal papilla(e) were hypertrophied.                           The exam was otherwise without abnormality. Complications:            No immediate complications. Estimated blood loss:                             Minimal. Estimated Blood Loss:     Estimated blood loss was minimal. Impression:               - The examined portion of the ileum was normal.                           - One diminutive polyp in the ascending colon,                            removed with a cold snare. Resected and retrieved.                           - Two 2 to 4 mm polyps in the sigmoid colon,  removed with a cold snare. Resected and retrieved.                           - Tortuous colon with some restricted mobility of                            the left colon.                           - Internal hemorrhoids.                           - Anal papilla(e) were hypertrophied.                           - The examination was otherwise normal.                           No cause for pain on this exam, although bowel                            spasm possible etiology. Recommendation:           - Patient has a contact number available for                            emergencies. The signs and symptoms of potential                            delayed complications were discussed with the                            patient. Return to normal activities tomorrow.                            Written discharge instructions were provided to the                            patient.                           - Resume previous diet.                           - Continue present medications.                           - Trial of IB gard or Bentyl to use as needed for                            discomfort                           - Await pathology results. Viviann Spare P. Ayasha Ellingsen, MD 08/07/2022 3:01:35 PM This report has been signed electronically.

## 2022-08-07 NOTE — Patient Instructions (Signed)

## 2022-08-08 ENCOUNTER — Telehealth: Payer: Self-pay

## 2022-08-08 NOTE — Telephone Encounter (Signed)
  Follow up Call-     08/07/2022    2:05 PM  Call back number  Post procedure Call Back phone  # [phone]  Permission to leave phone message Yes    Follow up call, LVM

## 2022-08-18 ENCOUNTER — Ambulatory Visit: Admission: RE | Admit: 2022-08-18 | Payer: Medicare HMO | Source: Ambulatory Visit

## 2022-08-18 DIAGNOSIS — R932 Abnormal findings on diagnostic imaging of liver and biliary tract: Secondary | ICD-10-CM | POA: Diagnosis not present

## 2022-08-18 DIAGNOSIS — D4101 Neoplasm of uncertain behavior of right kidney: Secondary | ICD-10-CM

## 2022-08-18 DIAGNOSIS — N2889 Other specified disorders of kidney and ureter: Secondary | ICD-10-CM | POA: Diagnosis not present

## 2022-08-18 MED ORDER — GADOPICLENOL 0.5 MMOL/ML IV SOLN
8.0000 mL | Freq: Once | INTRAVENOUS | Status: AC | PRN
  Administered 2022-08-18: 8 mL via INTRAVENOUS

## 2022-09-09 DIAGNOSIS — H401131 Primary open-angle glaucoma, bilateral, mild stage: Secondary | ICD-10-CM | POA: Diagnosis not present

## 2022-09-09 DIAGNOSIS — H43811 Vitreous degeneration, right eye: Secondary | ICD-10-CM | POA: Diagnosis not present

## 2022-09-09 DIAGNOSIS — H2511 Age-related nuclear cataract, right eye: Secondary | ICD-10-CM | POA: Diagnosis not present

## 2022-09-09 DIAGNOSIS — Z8669 Personal history of other diseases of the nervous system and sense organs: Secondary | ICD-10-CM | POA: Diagnosis not present

## 2022-09-11 ENCOUNTER — Encounter (INDEPENDENT_AMBULATORY_CARE_PROVIDER_SITE_OTHER): Payer: Medicare HMO | Admitting: Ophthalmology

## 2022-09-17 ENCOUNTER — Other Ambulatory Visit: Payer: Self-pay | Admitting: Internal Medicine

## 2022-09-17 DIAGNOSIS — Z1231 Encounter for screening mammogram for malignant neoplasm of breast: Secondary | ICD-10-CM

## 2022-09-19 DIAGNOSIS — E785 Hyperlipidemia, unspecified: Secondary | ICD-10-CM | POA: Diagnosis not present

## 2022-09-19 DIAGNOSIS — Z0000 Encounter for general adult medical examination without abnormal findings: Secondary | ICD-10-CM | POA: Diagnosis not present

## 2022-09-19 DIAGNOSIS — M810 Age-related osteoporosis without current pathological fracture: Secondary | ICD-10-CM | POA: Diagnosis not present

## 2022-09-19 DIAGNOSIS — R7989 Other specified abnormal findings of blood chemistry: Secondary | ICD-10-CM | POA: Diagnosis not present

## 2022-09-19 DIAGNOSIS — Z1212 Encounter for screening for malignant neoplasm of rectum: Secondary | ICD-10-CM | POA: Diagnosis not present

## 2022-09-26 DIAGNOSIS — F4321 Adjustment disorder with depressed mood: Secondary | ICD-10-CM | POA: Diagnosis not present

## 2022-09-26 DIAGNOSIS — R82998 Other abnormal findings in urine: Secondary | ICD-10-CM | POA: Diagnosis not present

## 2022-09-26 DIAGNOSIS — Z0000 Encounter for general adult medical examination without abnormal findings: Secondary | ICD-10-CM | POA: Diagnosis not present

## 2022-09-26 DIAGNOSIS — R011 Cardiac murmur, unspecified: Secondary | ICD-10-CM | POA: Diagnosis not present

## 2022-09-26 DIAGNOSIS — M25569 Pain in unspecified knee: Secondary | ICD-10-CM | POA: Diagnosis not present

## 2022-09-26 DIAGNOSIS — M810 Age-related osteoporosis without current pathological fracture: Secondary | ICD-10-CM | POA: Diagnosis not present

## 2022-09-26 DIAGNOSIS — K589 Irritable bowel syndrome without diarrhea: Secondary | ICD-10-CM | POA: Diagnosis not present

## 2022-09-26 DIAGNOSIS — R932 Abnormal findings on diagnostic imaging of liver and biliary tract: Secondary | ICD-10-CM | POA: Diagnosis not present

## 2022-09-26 DIAGNOSIS — Z853 Personal history of malignant neoplasm of breast: Secondary | ICD-10-CM | POA: Diagnosis not present

## 2022-09-26 DIAGNOSIS — D126 Benign neoplasm of colon, unspecified: Secondary | ICD-10-CM | POA: Diagnosis not present

## 2022-09-26 DIAGNOSIS — H3320 Serous retinal detachment, unspecified eye: Secondary | ICD-10-CM | POA: Diagnosis not present

## 2022-09-26 DIAGNOSIS — N281 Cyst of kidney, acquired: Secondary | ICD-10-CM | POA: Diagnosis not present

## 2022-09-26 DIAGNOSIS — K5900 Constipation, unspecified: Secondary | ICD-10-CM | POA: Diagnosis not present

## 2022-09-29 ENCOUNTER — Other Ambulatory Visit: Payer: Self-pay | Admitting: Internal Medicine

## 2022-09-29 DIAGNOSIS — R932 Abnormal findings on diagnostic imaging of liver and biliary tract: Secondary | ICD-10-CM

## 2022-09-30 ENCOUNTER — Ambulatory Visit: Payer: Medicare HMO | Admitting: Obstetrics and Gynecology

## 2022-11-13 ENCOUNTER — Ambulatory Visit
Admission: RE | Admit: 2022-11-13 | Discharge: 2022-11-13 | Disposition: A | Discharge status: Home / Self Care | Payer: Medicare HMO | Source: Ambulatory Visit | Attending: Internal Medicine | Admitting: Internal Medicine

## 2022-11-13 DIAGNOSIS — Z1231 Encounter for screening mammogram for malignant neoplasm of breast: Secondary | ICD-10-CM

## 2022-12-05 DIAGNOSIS — H01005 Unspecified blepharitis left lower eyelid: Secondary | ICD-10-CM | POA: Diagnosis not present

## 2022-12-05 DIAGNOSIS — H01002 Unspecified blepharitis right lower eyelid: Secondary | ICD-10-CM | POA: Diagnosis not present

## 2022-12-05 DIAGNOSIS — H0015 Chalazion left lower eyelid: Secondary | ICD-10-CM | POA: Diagnosis not present

## 2023-01-15 DIAGNOSIS — Z85828 Personal history of other malignant neoplasm of skin: Secondary | ICD-10-CM | POA: Diagnosis not present

## 2023-01-15 DIAGNOSIS — L718 Other rosacea: Secondary | ICD-10-CM | POA: Diagnosis not present

## 2023-01-15 DIAGNOSIS — L820 Inflamed seborrheic keratosis: Secondary | ICD-10-CM | POA: Diagnosis not present

## 2023-01-15 DIAGNOSIS — L821 Other seborrheic keratosis: Secondary | ICD-10-CM | POA: Diagnosis not present

## 2023-01-22 ENCOUNTER — Encounter: Payer: Self-pay | Admitting: Internal Medicine

## 2023-01-28 ENCOUNTER — Ambulatory Visit
Admission: RE | Admit: 2023-01-28 | Discharge: 2023-01-28 | Disposition: A | Discharge status: Home / Self Care | Payer: Medicare HMO | Source: Ambulatory Visit | Attending: Internal Medicine | Admitting: Internal Medicine

## 2023-01-28 DIAGNOSIS — N281 Cyst of kidney, acquired: Secondary | ICD-10-CM | POA: Diagnosis not present

## 2023-01-28 DIAGNOSIS — K449 Diaphragmatic hernia without obstruction or gangrene: Secondary | ICD-10-CM | POA: Diagnosis not present

## 2023-01-28 DIAGNOSIS — K828 Other specified diseases of gallbladder: Secondary | ICD-10-CM | POA: Diagnosis not present

## 2023-01-28 DIAGNOSIS — R932 Abnormal findings on diagnostic imaging of liver and biliary tract: Secondary | ICD-10-CM

## 2023-01-28 DIAGNOSIS — K838 Other specified diseases of biliary tract: Secondary | ICD-10-CM | POA: Diagnosis not present

## 2023-01-28 MED ORDER — GADOPICLENOL 0.5 MMOL/ML IV SOLN
8.0000 mL | Freq: Once | INTRAVENOUS | Status: AC | PRN
  Administered 2023-01-28: 8 mL via INTRAVENOUS

## 2023-03-05 IMAGING — MG MM DIGITAL SCREENING BILAT W/ TOMO AND CAD
6 of 10 series · 6 of 30 positions shown · non-contrast
Comparison: Previous exam(s).

CLINICAL DATA: Screening.

EXAM:
DIGITAL SCREENING BILATERAL MAMMOGRAM WITH TOMOSYNTHESIS AND CAD
TECHNIQUE: Bilateral screening digital craniocaudal and mediolateral oblique
mammograms were obtained. Bilateral screening digital breast
tomosynthesis was performed. The images were evaluated with
computer-aided detection.

[L CC synth-2D]
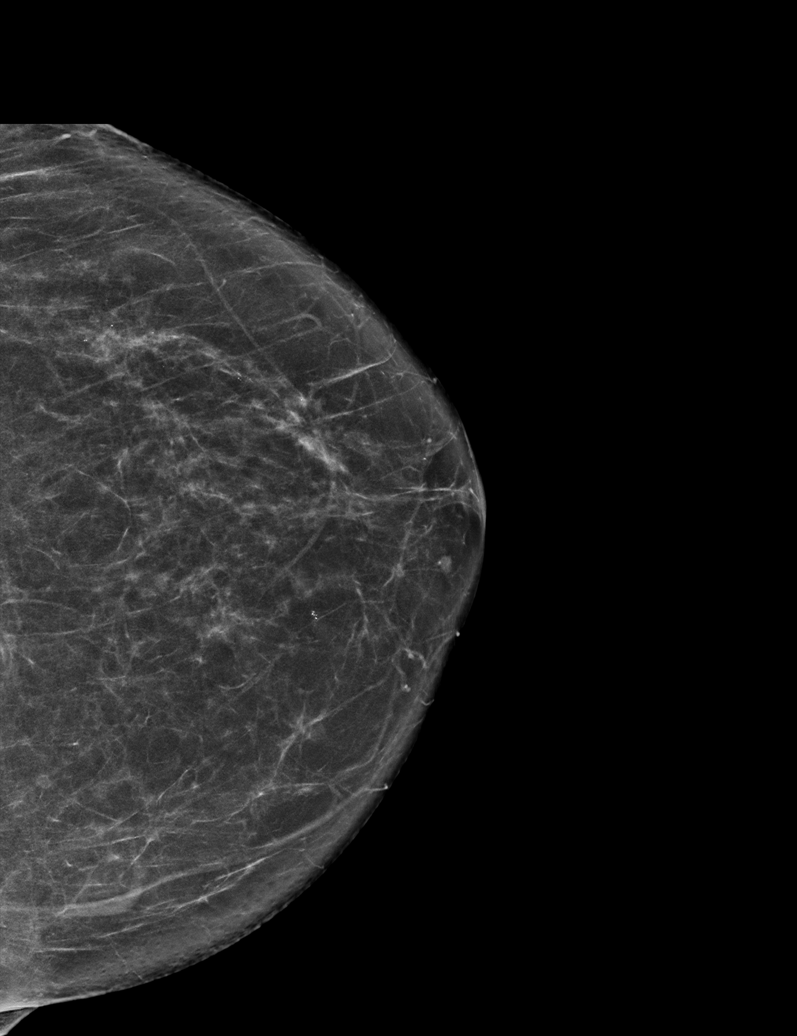

[L MLO synth-2D]
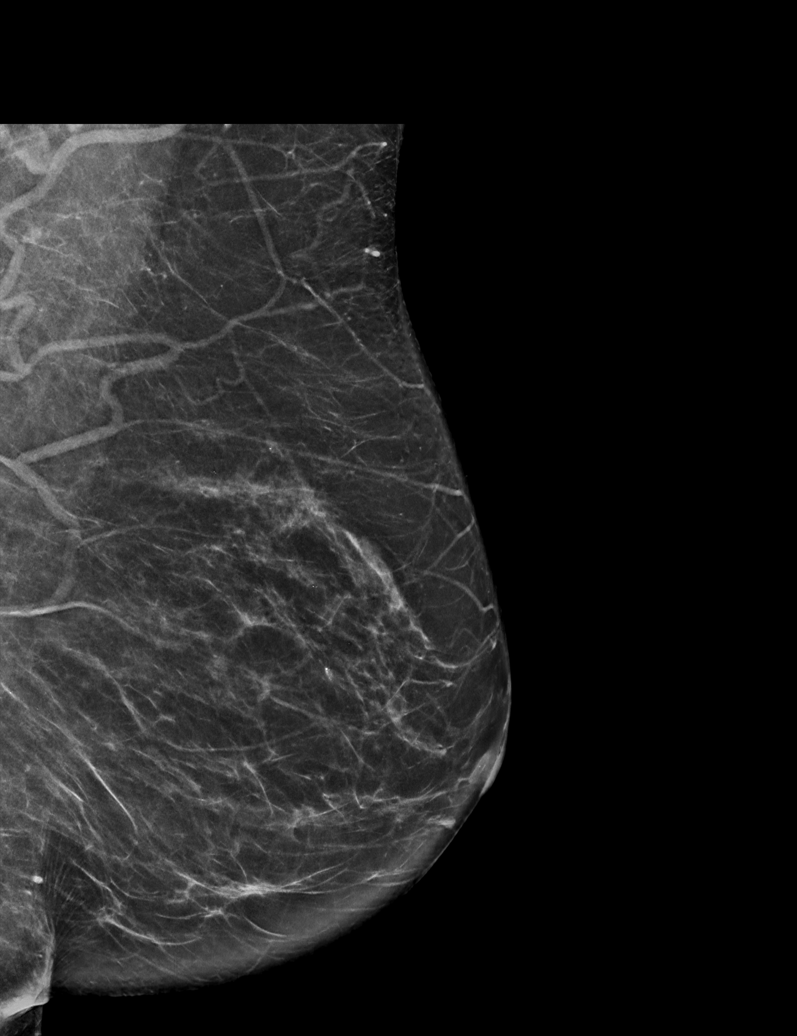

[R CC synth-2D (1 of 2)]
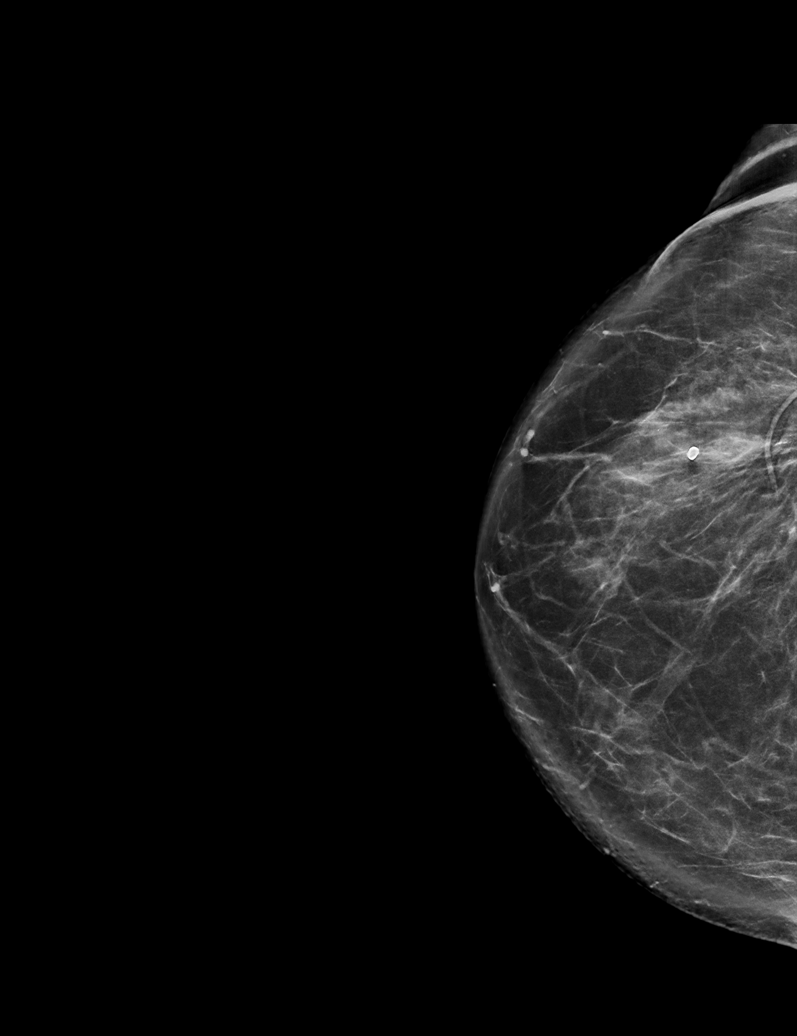

[R CC synth-2D (2 of 2)]
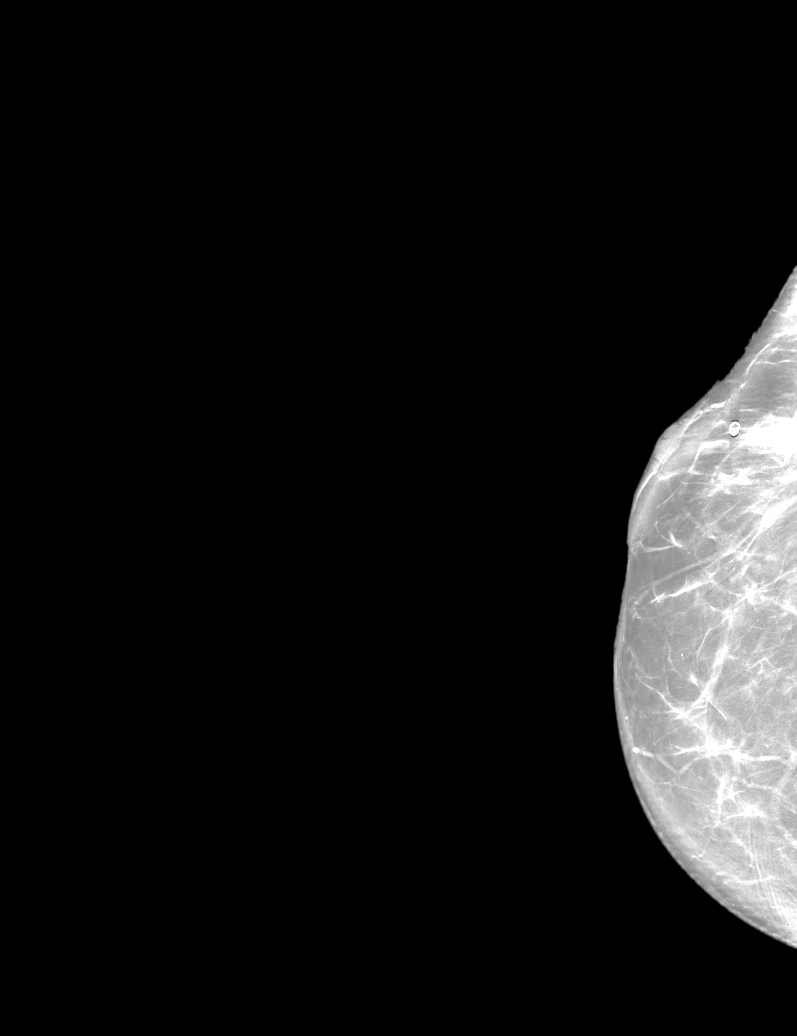

[R MLO synth-2D]
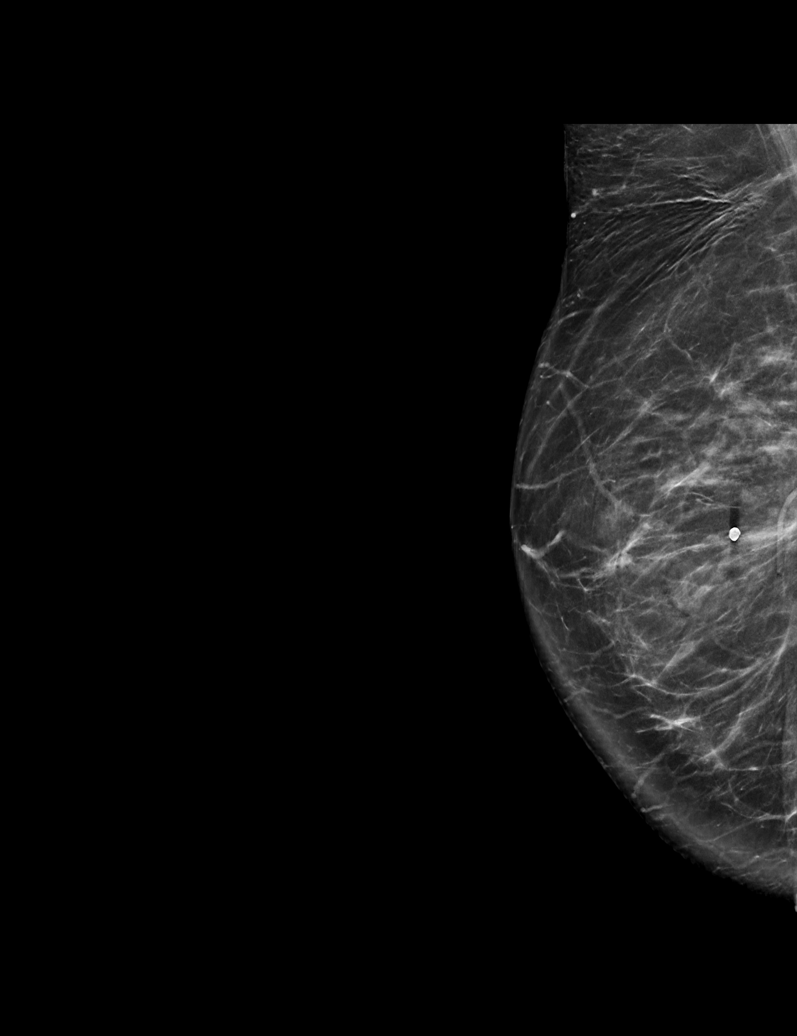

[L MLO tomo · tomo slice 36/71.0]
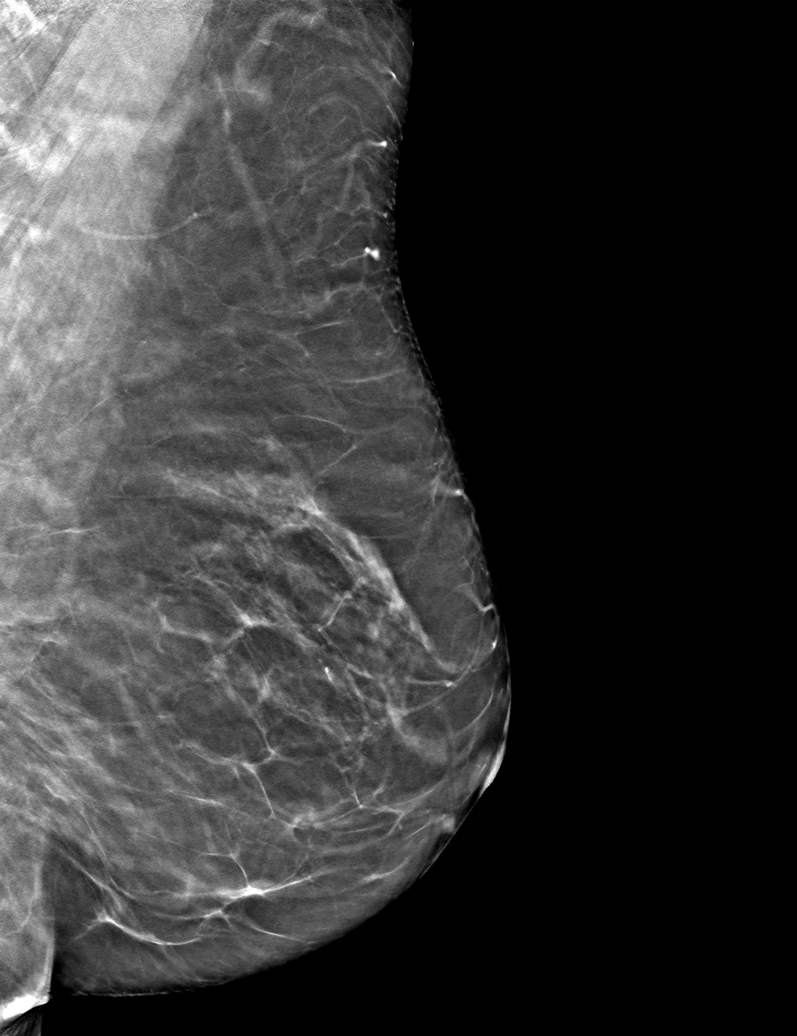

[6 of 30 positions shown; findings below may reference images not displayed]

ACR Breast Density Category b: There are scattered areas of
fibroglandular density.
FINDINGS: There are no findings suspicious for malignancy.
IMPRESSION: No mammographic evidence of malignancy. A result letter of this
screening mammogram will be mailed directly to the patient.

RECOMMENDATION:
Screening mammogram in one year. (Code:51-O-LD2)

BI-RADS CATEGORY  1: Negative.

## 2023-06-11 DIAGNOSIS — L817 Pigmented purpuric dermatosis: Secondary | ICD-10-CM | POA: Diagnosis not present

## 2023-06-22 NOTE — Progress Notes (Unsigned)
 73 y.o. G0P0000 Divorced Caucasian female here for a breast and pelvic exam.   Patient wants to loose weight.  Has [employer] membership.   The patient is also followed for lichen sclerosus and osteoporosis.  Sitting with a patient who has dementia, so she is not active.   PCP: Avva, Ravisankar, MD   No LMP recorded. Patient has had a hysterectomy.           Sexually active: No.  The current method of family planning is status post hysterectomy.    Menopausal hormone therapy:  n/a Exercising: No.    Smoker:  Former  OB History     Gravida  0   Para  0   Term  0   Preterm  0   AB  0   Living  0      SAB  0   IAB  0   Ectopic  0   Multiple  0   Live Births  0           HEALTH MAINTENANCE: Last 2 paps: 03/29/19 neg, 03/12/17 neg History of abnormal Pap or positive HPV:  no Mammogram:  11/13/22 Breast Density Cat B, BIRADS Cat 1 neg  Colonoscopy:  08/07/22  Bone Density:  02/04/22  Result  osteoporosis of forearm.  Immunization History  Administered Date(s) Administered   Fluad Quad(high Dose 65+) 10/21/2018, 01/19/2020   Influenza,inj,Quad PF,6+ Mos 11/25/2013, 03/10/2016, 12/23/2017      reports that she has quit smoking. She has never used smokeless tobacco. She reports current alcohol  use. She reports that she does not use drugs.  Past Medical History:  Diagnosis Date   Arthritis    Breast cancer (HCC) 2006   Right   Cancer [facility]) 2006   BREAST CANCER/infiltrating ductal, ER/PR positive   Cataract    Osteopenia 03/2015   T score -2.4 FRAX 7.5%/0.5%  stable from prior DEXA   Retinal detachment    with lens left eye    Past Surgical History:  Procedure Laterality Date   ABDOMINAL HYSTERECTOMY  2003   TAH,BSO mucinous cystadenoma/leiomyoma/endosalpingosis   BREAST BIOPSY  08/1999   X 2 BREAST BX--ATYPICAL DUCTAL HYPERPLASIA   BREAST LUMPECTOMY Right 2006   BREAST CANCER 2006   COLONOSCOPY     EYE SURGERY  2009/2010   RETINA  EYE SURGERY   OOPHORECTOMY     BSO   RETINAL DETACHMENT SURGERY  2009-2010   x5   TONSILLECTOMY  1958    Current Outpatient Medications  Medication Sig Dispense Refill   CALCIUM PO Take by mouth.     Cholecalciferol (VITAMIN D  PO) Take by mouth.     clobetasol  ointment (TEMOVATE ) 0.05 % Apply 1 Application topically 2 (two) times a week. 30 g 4   No current facility-administered medications for this visit.    ALLERGIES: Combigan [brimonidine tartrate-timolol], Dorzolamide, Ketoconazole, Mydriacyl [tropicamide], Other, Timolol, and Belladonna alkaloids  Family History  Problem Relation Age of Onset   Cancer Mother        UTERINE   Heart disease Mother    Hypertension Father    Breast cancer Neg Hx    Colon cancer Neg Hx    Esophageal cancer Neg Hx    Rectal cancer Neg Hx    Stomach cancer Neg Hx     Review of Systems  All other systems reviewed and are negative.   PHYSICAL EXAM:  BP 118/76 (BP Location: Left Arm, Patient Position: Sitting)   Pulse  74   Ht 5' 3.5" (1.613 m)   Wt 170 lb (77.1 kg)   SpO2 95%   BMI 29.64 kg/m     General appearance: alert, cooperative and appears stated age Head: normocephalic, without obvious abnormality, atraumatic Neck: no adenopathy, supple, symmetrical, trachea midline and thyroid  normal to inspection and palpation Lungs: clear to auscultation bilaterally Breasts: right - scarring and retraction of right lateral breast, no masses or tenderness, No nipple retraction or dimpling, No nipple discharge or bleeding, No axillary adenopathy Left - normal appearance, no masses or tenderness, No nipple retraction or dimpling, No nipple discharge or bleeding, No axillary adenopathy Heart: regular rate and rhythm Abdomen: soft, non-tender; no masses, no organomegaly Extremities: extremities normal, atraumatic, no cyanosis or edema Skin: skin color, texture, turgor normal. No rashes or lesions Lymph nodes: cervical, supraclavicular, and  axillary nodes normal. Neurologic: grossly normal  Pelvic: External genitalia:  white epithelium of perineum.               No abnormal inguinal nodes palpated.              Urethra:  normal appearing urethra with no masses, tenderness or lesions              Bartholins and Skenes: normal                 Vagina: normal appearing vagina with normal color and discharge, no lesions              Cervix: absent              Pap taken: no Bimanual Exam:  Uterus:  absent              Adnexa: no mass, fullness, tenderness              Rectal exam: yes.  Confirms.              Anus:  normal sphincter tone, no lesions  Chaperone was present for exam:  Cottie Diss, CMA  ASSESSMENT: Encounter for breast and pelvic exam.  Status post TAH/BSO.  Remote hx of cryotherapy to cervix. Hx right breast cancer.  Status post lumpectomy, XRT, and Tamoxifen.  Hx lichen sclerosus.  Encounter for medication monitoring.  Osteoporosis of forearm, osteopenia of hips.   PLAN: Mammogram screening discussed. Self breast awareness reviewed. Pap and HRV collected:  no.  Not indicated.  Guidelines for Calcium, Vitamin D , regular exercise program including cardiovascular and weight bearing exercise. Medication refills:  Clobetasol  ointment.  Instructed in use.  Labs with PCP.  [facility]  Patient will call to schedule.  Follow up:  yearly and prn.     Additional counseling given.  yes. 20 min  total time was spent for this patient encounter, including preparation, face-to-face counseling with the patient, coordination of care, and documentation of the encounter in addition to doing the breast and pelvic exam.

## 2023-06-23 ENCOUNTER — Encounter: Payer: Self-pay | Admitting: Obstetrics and Gynecology

## 2023-06-23 ENCOUNTER — Ambulatory Visit (INDEPENDENT_AMBULATORY_CARE_PROVIDER_SITE_OTHER): Payer: Medicare HMO | Admitting: Obstetrics and Gynecology

## 2023-06-23 VITALS — BP 118/76 | HR 74 | Ht 63.5 in | Wt 170.0 lb

## 2023-06-23 DIAGNOSIS — N904 Leukoplakia of vulva: Secondary | ICD-10-CM | POA: Diagnosis not present

## 2023-06-23 DIAGNOSIS — Z01419 Encounter for gynecological examination (general) (routine) without abnormal findings: Secondary | ICD-10-CM

## 2023-06-23 DIAGNOSIS — M810 Age-related osteoporosis without current pathological fracture: Secondary | ICD-10-CM | POA: Diagnosis not present

## 2023-06-23 DIAGNOSIS — Z5181 Encounter for therapeutic drug level monitoring: Secondary | ICD-10-CM | POA: Diagnosis not present

## 2023-06-23 MED ORDER — CLOBETASOL PROPIONATE 0.05 % EX OINT: TOPICAL_OINTMENT | CUTANEOUS | 1 refills | Status: AC

## 2023-06-23 NOTE — Patient Instructions (Signed)

## 2023-07-22 DIAGNOSIS — H52203 Unspecified astigmatism, bilateral: Secondary | ICD-10-CM | POA: Diagnosis not present

## 2023-07-22 DIAGNOSIS — H2511 Age-related nuclear cataract, right eye: Secondary | ICD-10-CM | POA: Diagnosis not present

## 2023-08-13 IMAGING — CT CT HEAD W/O CM
4 series · 17 of 47 positions shown, 19 images · non-contrast
Comparison: None.

CLINICAL DATA: Moderate to severe head trauma.



[Series 3: head wo · axial · 0.43mm/px · z∈[-114,+10]mm · 7 of 35 slices shown, 9 images]
[im 5/35  brain]
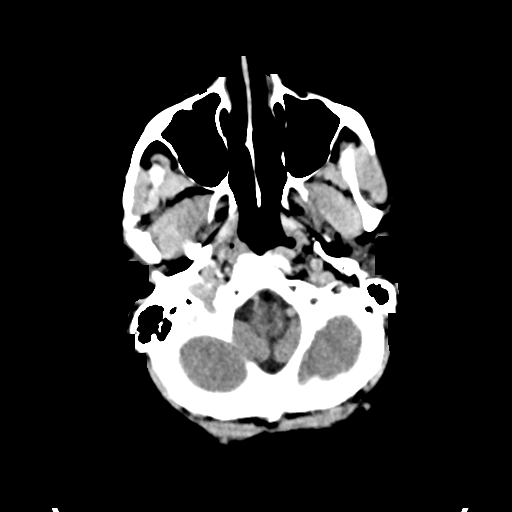
[im 5/35  bone]
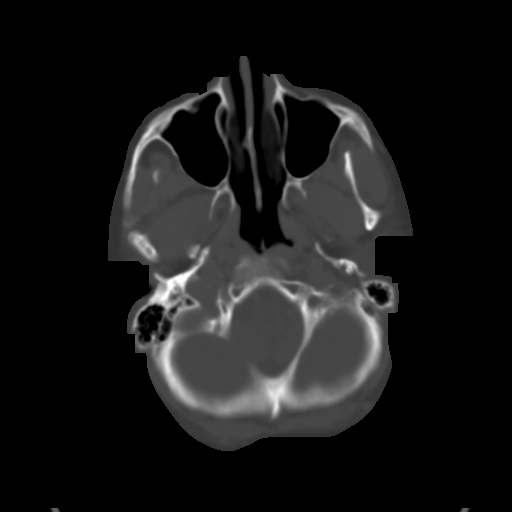
[im 9/35  brain]
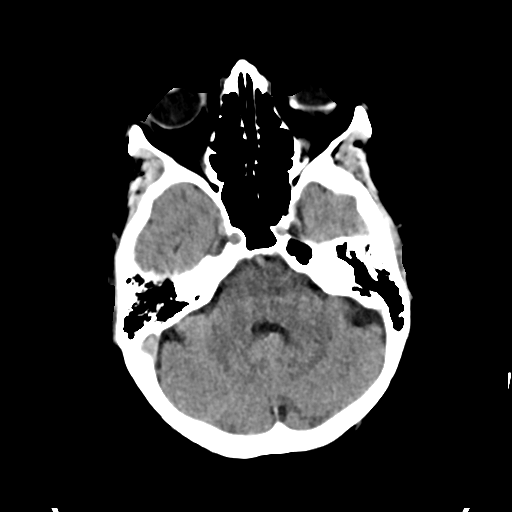
[im 13/35  brain]
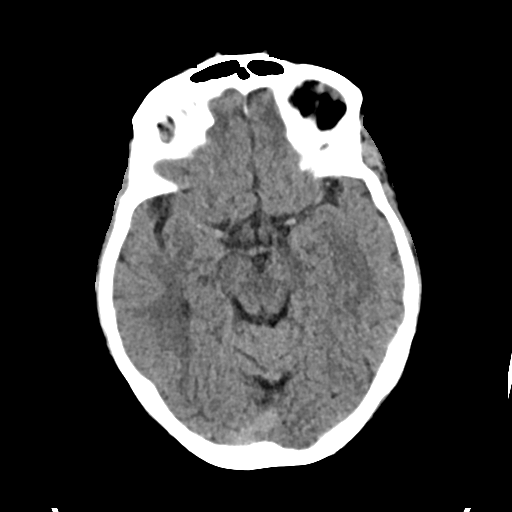
[im 18/35  brain]
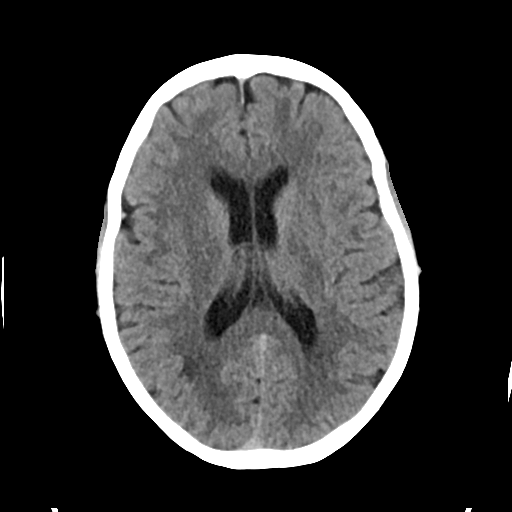
[im 22/35  brain]
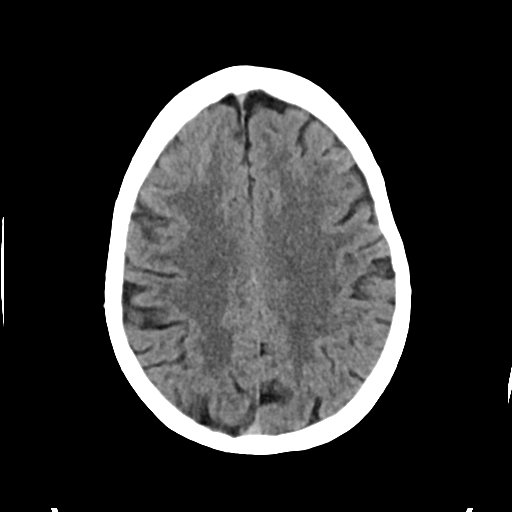
[im 22/35  bone]
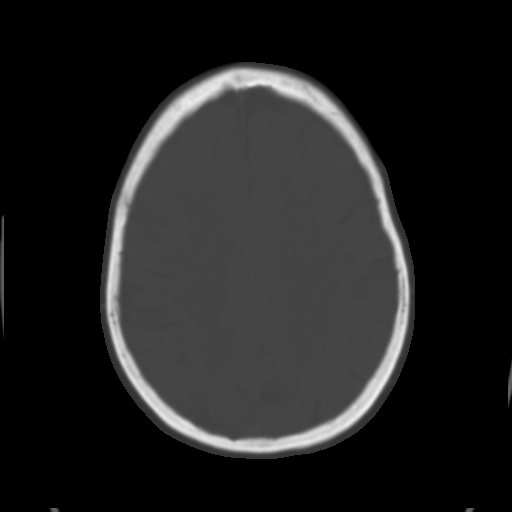
[im 26/35  brain]
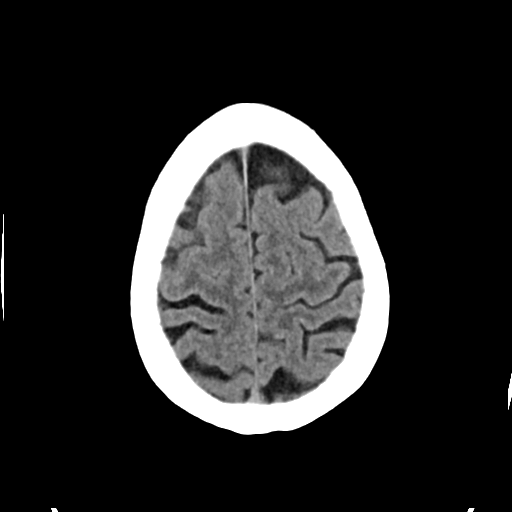
[im 30/35  brain]
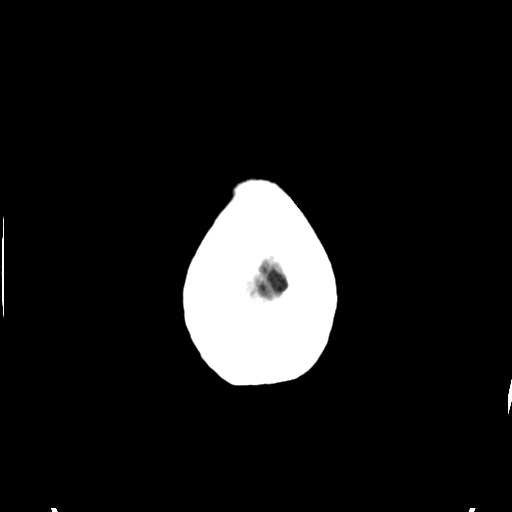

[Series 4: head bone · axial · 0.43mm/px · z∈[-118,-58]mm · 4 of 86 slices shown]
[im 9/86  bone]
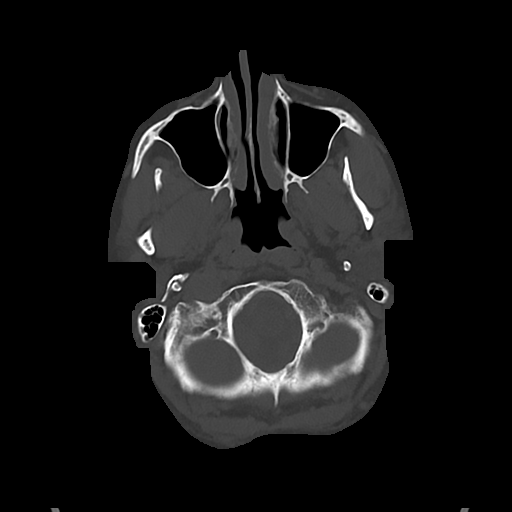
[im 18/86  bone]
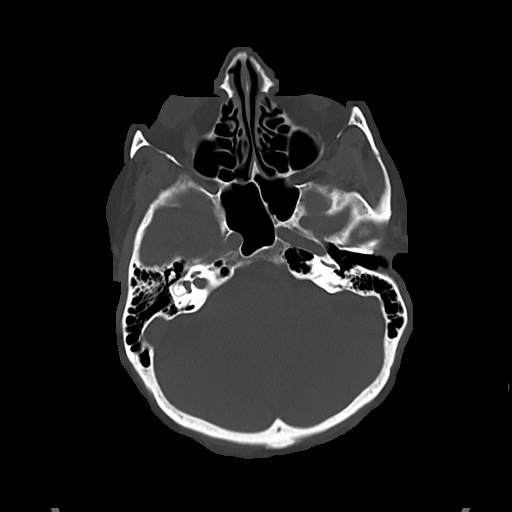
[im 26/86  bone]
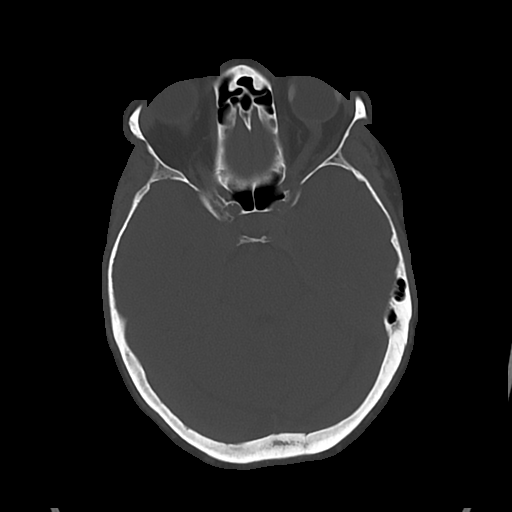
[im 39/86  bone]
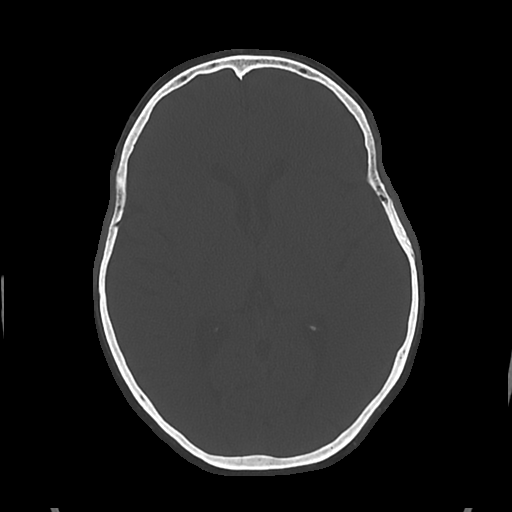

[Series 5: cor soft · coronal · 0.32mm/px · 3 of 68 slices shown]
[im 23/68  brain]
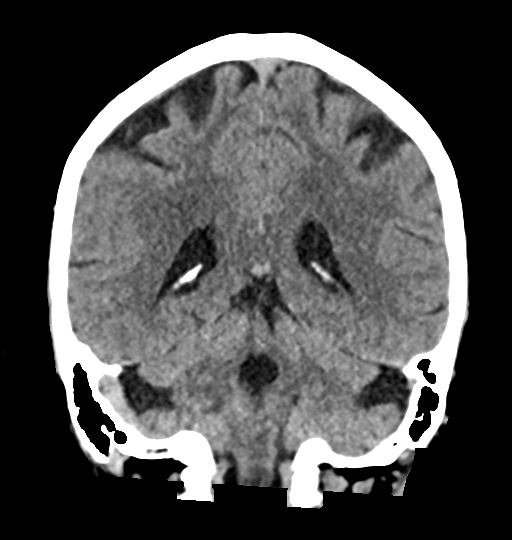
[im 30/68  brain]
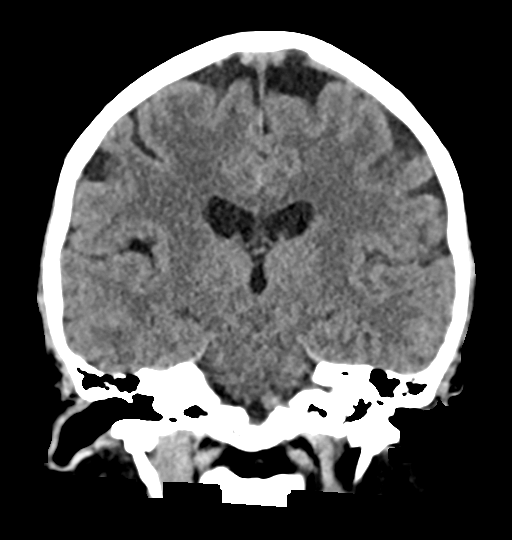
[im 38/68  brain]
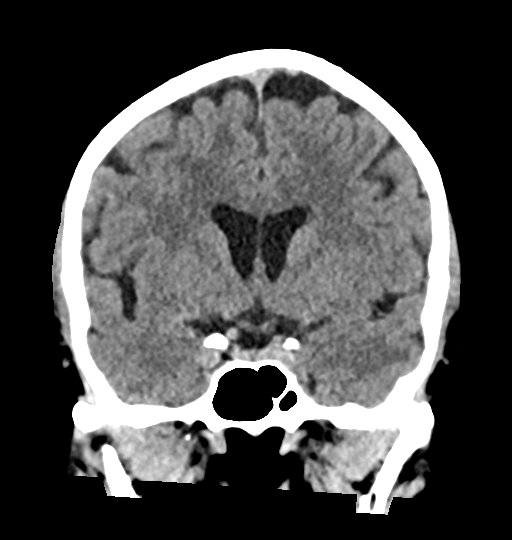

[Series 6: sag soft · sagittal · 0.33mm/px · 3 of 56 slices shown]
[im 19/56  brain]
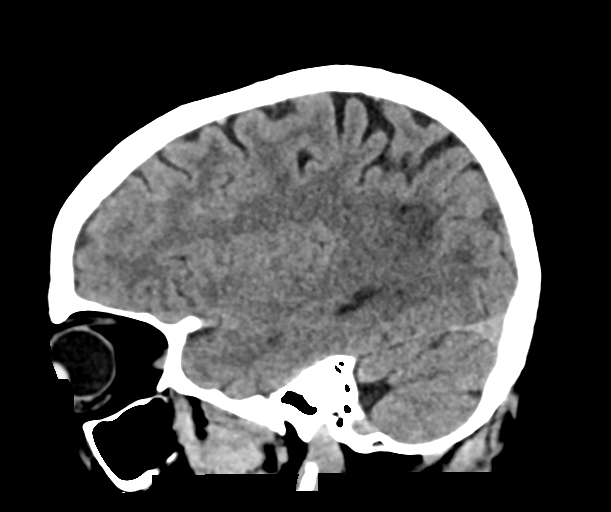
[im 28/56  brain]
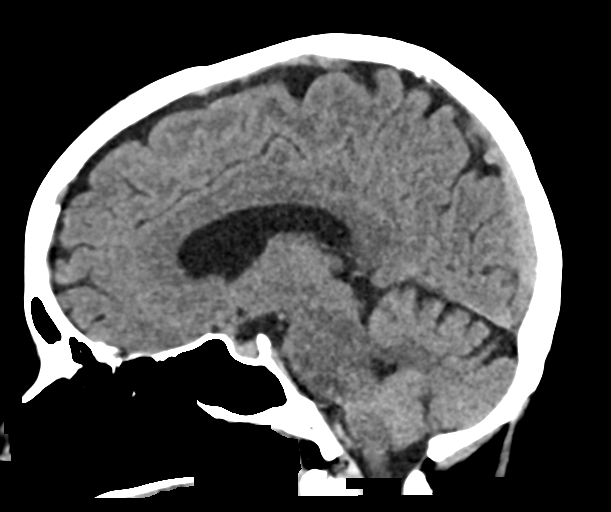
[im 37/56  brain]
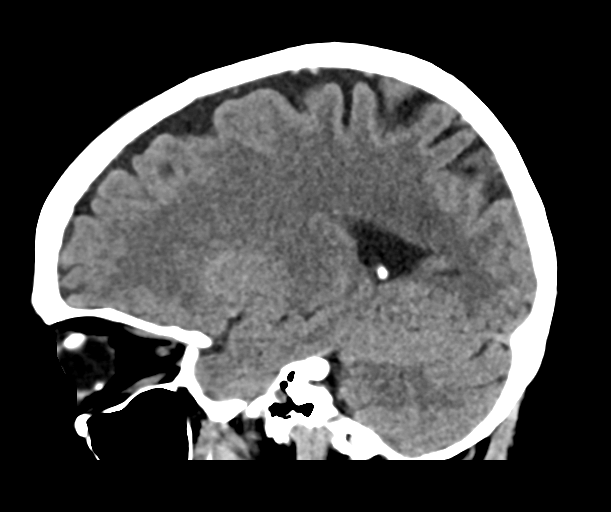

[17 of 47 positions shown; findings below may reference images not displayed]

FINDINGS: Brain: No evidence of acute infarction, hemorrhage, hydrocephalus,
extra-axial collection or mass lesion/mass effect. Low-density in
the biparietal white matter attributed to chronic small vessel
ischemia. Age normal brain volume.

Vascular: No hyperdense vessel or unexpected calcification.

Skull: Negative for fracture

Sinuses/Orbits: Left cataract resection and scleral banding.
IMPRESSION: No evidence of intracranial injury.

## 2023-09-16 ENCOUNTER — Other Ambulatory Visit: Payer: Self-pay | Admitting: Internal Medicine

## 2023-09-16 DIAGNOSIS — Z1231 Encounter for screening mammogram for malignant neoplasm of breast: Secondary | ICD-10-CM

## 2023-10-16 DIAGNOSIS — Z0000 Encounter for general adult medical examination without abnormal findings: Secondary | ICD-10-CM | POA: Diagnosis not present

## 2023-10-16 DIAGNOSIS — R7989 Other specified abnormal findings of blood chemistry: Secondary | ICD-10-CM | POA: Diagnosis not present

## 2023-10-23 DIAGNOSIS — D126 Benign neoplasm of colon, unspecified: Secondary | ICD-10-CM | POA: Diagnosis not present

## 2023-10-23 DIAGNOSIS — M25569 Pain in unspecified knee: Secondary | ICD-10-CM | POA: Diagnosis not present

## 2023-10-23 DIAGNOSIS — E663 Overweight: Secondary | ICD-10-CM | POA: Diagnosis not present

## 2023-10-23 DIAGNOSIS — K589 Irritable bowel syndrome without diarrhea: Secondary | ICD-10-CM | POA: Diagnosis not present

## 2023-10-23 DIAGNOSIS — F4321 Adjustment disorder with depressed mood: Secondary | ICD-10-CM | POA: Diagnosis not present

## 2023-10-23 DIAGNOSIS — K5900 Constipation, unspecified: Secondary | ICD-10-CM | POA: Diagnosis not present

## 2023-10-23 DIAGNOSIS — I872 Venous insufficiency (chronic) (peripheral): Secondary | ICD-10-CM | POA: Diagnosis not present

## 2023-10-23 DIAGNOSIS — N281 Cyst of kidney, acquired: Secondary | ICD-10-CM | POA: Diagnosis not present

## 2023-10-23 DIAGNOSIS — H3320 Serous retinal detachment, unspecified eye: Secondary | ICD-10-CM | POA: Diagnosis not present

## 2023-10-23 DIAGNOSIS — R932 Abnormal findings on diagnostic imaging of liver and biliary tract: Secondary | ICD-10-CM | POA: Diagnosis not present

## 2023-10-23 DIAGNOSIS — M810 Age-related osteoporosis without current pathological fracture: Secondary | ICD-10-CM | POA: Diagnosis not present

## 2023-10-23 DIAGNOSIS — R011 Cardiac murmur, unspecified: Secondary | ICD-10-CM | POA: Diagnosis not present

## 2023-11-03 DIAGNOSIS — H2511 Age-related nuclear cataract, right eye: Secondary | ICD-10-CM | POA: Diagnosis not present

## 2023-11-03 DIAGNOSIS — Z8669 Personal history of other diseases of the nervous system and sense organs: Secondary | ICD-10-CM | POA: Diagnosis not present

## 2023-11-03 DIAGNOSIS — H43811 Vitreous degeneration, right eye: Secondary | ICD-10-CM | POA: Diagnosis not present

## 2023-11-03 DIAGNOSIS — H401131 Primary open-angle glaucoma, bilateral, mild stage: Secondary | ICD-10-CM | POA: Diagnosis not present

## 2023-11-16 ENCOUNTER — Ambulatory Visit
Admission: RE | Admit: 2023-11-16 | Discharge: 2023-11-16 | Disposition: A | Discharge status: Home / Self Care | Source: Ambulatory Visit | Attending: Internal Medicine | Admitting: Internal Medicine

## 2023-11-16 DIAGNOSIS — Z1231 Encounter for screening mammogram for malignant neoplasm of breast: Secondary | ICD-10-CM | POA: Diagnosis not present

## 2023-11-19 ENCOUNTER — Other Ambulatory Visit: Payer: Self-pay | Admitting: Internal Medicine

## 2023-11-19 DIAGNOSIS — R928 Other abnormal and inconclusive findings on diagnostic imaging of breast: Secondary | ICD-10-CM

## 2023-11-23 ENCOUNTER — Other Ambulatory Visit: Payer: Self-pay | Admitting: Internal Medicine

## 2023-11-23 ENCOUNTER — Ambulatory Visit
Admission: RE | Admit: 2023-11-23 | Discharge: 2023-11-23 | Disposition: A | Source: Ambulatory Visit | Attending: Internal Medicine | Admitting: Internal Medicine

## 2023-11-23 DIAGNOSIS — R928 Other abnormal and inconclusive findings on diagnostic imaging of breast: Secondary | ICD-10-CM

## 2023-11-23 DIAGNOSIS — R921 Mammographic calcification found on diagnostic imaging of breast: Secondary | ICD-10-CM | POA: Diagnosis not present

## 2023-11-25 ENCOUNTER — Ambulatory Visit
Admission: RE | Admit: 2023-11-25 | Discharge: 2023-11-25 | Disposition: A | Discharge status: Home / Self Care | Source: Ambulatory Visit | Attending: Internal Medicine | Admitting: Internal Medicine

## 2023-11-25 DIAGNOSIS — R921 Mammographic calcification found on diagnostic imaging of breast: Secondary | ICD-10-CM | POA: Diagnosis not present

## 2023-11-25 DIAGNOSIS — R928 Other abnormal and inconclusive findings on diagnostic imaging of breast: Secondary | ICD-10-CM

## 2023-11-25 HISTORY — PX: BREAST BIOPSY: SHX20

## 2023-11-26 LAB — SURGICAL PATHOLOGY

## 2023-11-30 ENCOUNTER — Encounter: Payer: Self-pay | Admitting: *Deleted

## 2023-11-30 DIAGNOSIS — C50412 Malignant neoplasm of upper-outer quadrant of left female breast: Secondary | ICD-10-CM | POA: Insufficient documentation

## 2023-12-01 ENCOUNTER — Encounter: Payer: Self-pay | Admitting: *Deleted

## 2023-12-01 NOTE — Progress Notes (Signed)
 Radiation Oncology         [phone] ________________________________  Initial Outpatient Consultation  Name: Norma Mayer MRN: [id number]  Date: 12/02/2023  DOB: 11/29/1950  RR:Jccj, Ravisankar, MD  Curvin Deward MOULD, MD   REFERRING PHYSICIAN: Curvin Deward III, MD  DIAGNOSIS: No diagnosis found.   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 Prognostics could not be performed on the submitted specimen.   History of right breast cancer (IDC) diagnosed in 2006, s/p right breast lumpectomy with right axillary SLN excisions followed by XRT  CHIEF COMPLAINT: Here to discuss management of left breast cancer  HISTORY OF PRESENT ILLNESS::Norma Mayer is a 73 y.o. female who presented with a left breast abnormality on the following imaging: bilateral screening mammogram on the date of 11/16/23.  No symptoms, if any, were reported at that time. She accordingly presented for a left breast diagnostic mammogram on 11/23/23 that further revealed a suspicious group of calcifications in the upper outer left breast spanning approximately 8 cm. No evidence of left axillary lymphadenopathy was demonstrated, however this is in the absence of sonographic evaluation of the left breast breast and axilla.   Biopsy of the anterior portion of the left breast calcifications on the date of 11/25/23 showed grade 2 invasive mammary carcinoma measuring 2.5 mm in the greatest linear extent of the sample, along with calcifications present within the adjacent breast parenchyma and a small complex sclerosing lesion with UDH. Immunostaining for phenotypic  categorization (E-cadherin) could not  be performed given that the tumor tissue was lost on deeper levels. However, a lobular phenotype is favored based on the specimens histomorphology. A prognostic panel was also not performed in this setting.   ***  PREVIOUS RADIATION THERAPY: Yes   History of right breast invasive mammary/ductal carcinoma diagnosed in 2006, ER/PR+, Her2 borderline (2+) ; s/p right breast lumpectomy with SLN excisions and adjuvant radiation therapy   PAST MEDICAL HISTORY:  has a past medical history of Arthritis, Breast cancer (HCC) (2006), Cancer (HCC) (2006), Cataract, Osteopenia (03/2015), and Retinal detachment.    PAST SURGICAL HISTORY: Past Surgical History:  Procedure Laterality Date   ABDOMINAL HYSTERECTOMY  2003   TAH,BSO mucinous cystadenoma/leiomyoma/endosalpingosis   BREAST BIOPSY  08/1999   X 2 BREAST BX--ATYPICAL DUCTAL HYPERPLASIA   BREAST BIOPSY Left 11/25/2023   MM LT BREAST BX W LOC DEV 1ST LESION IMAGE BX SPEC STEREO GUIDE 11/25/2023 [facility]   BREAST BIOPSY Left 11/25/2023   MM LT BREAST BX W LOC DEV EA AD LESION IMG BX SPEC STEREO GUIDE 11/25/2023 [facility]   BREAST LUMPECTOMY Right 2006   BREAST CANCER 2006   COLONOSCOPY     EYE SURGERY  2009/2010   RETINA EYE SURGERY   OOPHORECTOMY     BSO   RETINAL DETACHMENT SURGERY  2009-2010   x5   TONSILLECTOMY  1958    FAMILY HISTORY: family history includes Heart disease in her mother; Hypertension in her father; Uterine cancer in her mother.  SOCIAL HISTORY:  reports that she has quit smoking. She has never used smokeless tobacco. She reports current alcohol  use. She reports that she does not use drugs.  ALLERGIES: Combigan [  brimonidine tartrate-timolol], Dorzolamide, Ketoconazole, Mydriacyl [tropicamide], Other, Timolol, and Belladonna alkaloids  MEDICATIONS:  Current Outpatient Medications  Medication Sig Dispense Refill   CALCIUM PO Take by mouth.     Cholecalciferol  (VITAMIN D  PO) Take by mouth.     clobetasol  ointment (TEMOVATE ) 0.05 % Place on the skin twice a day for 2 weeks for a flare and then twice a week at bedtime for maintenance dosing. 30 g 1   No current facility-administered medications for this encounter.    REVIEW OF SYSTEMS: As above in HPI.   PHYSICAL EXAM:  vitals were not taken for this visit.   General: Alert and oriented, in no acute distress HEENT: Head is normocephalic. Extraocular movements are intact.  Heart: Regular in rate and rhythm with no murmurs, rubs, or gallops. Chest: Clear to auscultation bilaterally, with no rhonchi, wheezes, or rales. Abdomen: Soft, nontender, nondistended, with no rigidity or guarding. Extremities: No cyanosis or edema. Skin: No concerning lesions. Musculoskeletal: symmetric strength and muscle tone throughout. Neurologic: Cranial nerves II through XII are grossly intact. No obvious focalities. Speech is fluent. Coordination is intact. Psychiatric: Judgment and insight are intact. Affect is appropriate. Breasts: *** . No other palpable masses appreciated in the breasts or axillae *** .    ECOG = ***  0 - Asymptomatic (Fully active, able to carry on all predisease activities without restriction)  1 - Symptomatic but completely ambulatory (Restricted in physically strenuous activity but ambulatory and able to carry out work of a light or sedentary nature. For example, light housework, office work)  2 - Symptomatic, <50% in bed during the day (Ambulatory and capable of all self care but unable to carry out any work activities. Up and about more than 50% of waking hours)  3 - Symptomatic, >50% in bed, but not bedbound (Capable of only limited self-care, confined to bed or chair 50% or more of waking hours)  4 - Bedbound (Completely disabled. Cannot carry on any self-care. Totally confined to bed or chair)  5 - Death   Raylene MM, Creech RH, Tormey DC, et al. [phone]). Toxicity and response  criteria of the [facility]. Am. DOROTHA Bridges. Oncol. 5 (6): 649-55   LABORATORY DATA:   CBC    Component Value Date/Time   WBC 4.6 03/08/2010 1555   WBC 5.5 05/22/2008 0955   RBC 4.19 03/08/2010 1555   RBC 4.61 05/22/2008 0955   HGB 13.5 03/08/2010 1555   HCT 39.2 03/08/2010 1555   PLT 204 03/08/2010 1555   MCV 93.5 03/08/2010 1555   MCH 32.1 03/08/2010 1555   MCHC 34.3 03/08/2010 1555   MCHC 34.8 05/22/2008 0955   RDW 13.3 03/08/2010 1555   LYMPHSABS 1.2 03/08/2010 1555   MONOABS 0.6 03/08/2010 1555   EOSABS 0.1 03/08/2010 1555   BASOSABS 0.0 03/08/2010 1555    CMP     Component Value Date/Time   NA 136 03/08/2010 1555   K 3.8 03/08/2010 1555   CL 102 03/08/2010 1555   CO2 30 03/08/2010 1555   GLUCOSE 91 03/08/2010 1555   BUN 15 03/08/2010 1555   CREATININE 0.86 03/08/2010 1555   CALCIUM 9.0 03/08/2010 1555   PROT 6.5 03/08/2010 1555   ALBUMIN 3.8 03/08/2010 1555   AST 23 03/08/2010 1555   ALT 10 03/08/2010 1555   ALKPHOS 49 03/08/2010 1555   BILITOT 0.7 03/08/2010 1555   GFRNONAA >60 05/22/2008 0955      RADIOGRAPHY: MM LT BREAST BX W  LOC DEV 1ST LESION IMAGE BX SPEC STEREO GUIDE Addendum Date: 11/30/2023 ADDENDUM REPORT: 11/30/2023 08:27 ADDENDUM: PATHOLOGY revealed: Site 1. Breast, left, needle core biopsy, anterior calcs - INVASIVE MAMMARY CARCINOMA- OVERALL GRADE: 2 - LYMPHOVASCULAR INVASION: NOT IDENTIFIED - CANCER LENGTH: 2.5 MM - CALCIFICATIONS: PRESENT, WITHIN ADJACENT BREAST PARENCHYMA - OTHER FINDINGS: SMALL COMPLEX SCLEROSING LESION WITH USUAL DUCTAL HYPERPLASIA AND COLUMNAR CELL CHANGE Pathology results are CONCORDANT with imaging findings, per Dr. Curtistine Noble. PATHOLOGY revealed: Site 2. Breast, left, needle core biopsy, posterior calcs (coil clip)- ATYPICAL DUCTAL HYPERPLASIA INVOLVING A SMALL COMPLEX CLOSING LESION, SEE NOTE - CALCIFICATIONS PRESENT Pathology results are CONCORDANT with imaging findings, per Dr. Curtistine Noble. Pathology results and recommendations below were discussed with patient by telephone on 11/26/2023 by Mliss Molt RN. Patient reported biopsy site within normal limits with slight tenderness at the site. Post biopsy care instructions were reviewed, questions were answered and my direct phone number was provided to patient. Patient was instructed to call [facility] if any concerns or questions arise related to the biopsy. RECOMMENDATION: 1. Surgical and oncological consultation. The patient was referred to [facility] with appointment on 12/02/2023. 2. Recommend pretreatment bilateral breast MRI with and without contrast to determine extent of breast disease given diagnosis. Pathology results reported by Mliss Molt RN 11/27/2023. Electronically Signed   By: Curtistine Noble   On: 11/30/2023 08:27   Result Date: 11/30/2023 CLINICAL DATA:  73 year old female here for left breast stereotactic biopsy for indeterminate calcifications. EXAM: LEFT BREAST STEREOTACTIC CORE NEEDLE BIOPSY COMPARISON:  Previous exam(s). FINDINGS: The procedure was discussed with the patient including benefits and alternatives. We discussed the risks of the procedure, including infection, bleeding, tissue injury and inadequate sampling. Post biopsy clip placement and possible clip migration were also discussed. Informed written consent was given. The usual time-out protocol was performed immediately prior to the procedure. Site #1 Left Breast:        Quadrant: Upper outer anterior quadrant Pre-procedural images were obtained and the calcifications were localized which correlates with the area of concern seen on prior imaging studies. This area was targeted for stereotactic guided core needle biopsy. Using sterile technique and 1% Lidocaine with and without epinephrine as local anesthetic, under stereotactic guidance, a 9 gauge vacuum  assisted device was used to perform core needle biopsy of the calcifications using a superior approach. Specimen radiograph was performed showing the targeted calcifications. Next, a Ribbon shaped clip was placed in the sampled area. There were no immediate post-procedure complications. Site #2 Left Breast:        Quadrant: Upper outer posterior quadrant Pre-procedural images were obtained and the calcifications were localized which correlates with the area of concern seen on prior imaging studies. This area was targeted for stereotactic guided core needle biopsy. Using sterile technique and 1% Lidocaine with and without epinephrine as local anesthetic, under stereotactic guidance, a 9 gauge vacuum assisted device was used to perform core needle biopsy of the calcifications using a superior approach. Specimen radiograph was performed showing the targeted calcifications. Next, a coil shaped clip was placed in the sampled area. There were no immediate post-procedure complications. Follow-up 2-view mammogram was performed and dictated separately. IMPRESSION: Two site stereotactic-guided biopsy of the left breast as above. No apparent complications. Electronically Signed: By: Curtistine Noble On: 11/25/2023 09:35   MM LT BREAST BX W LOC DEV EA AD LESION IMG BX SPEC STEREO GUIDE  Addendum Date: 11/30/2023 ADDENDUM REPORT: 11/30/2023 08:27 ADDENDUM: PATHOLOGY revealed: Site 1. Breast, left, needle core biopsy, anterior calcs - INVASIVE MAMMARY CARCINOMA- OVERALL GRADE: 2 - LYMPHOVASCULAR INVASION: NOT IDENTIFIED - CANCER LENGTH: 2.5 MM - CALCIFICATIONS: PRESENT, WITHIN ADJACENT BREAST PARENCHYMA - OTHER FINDINGS: SMALL COMPLEX SCLEROSING LESION WITH USUAL DUCTAL HYPERPLASIA AND COLUMNAR CELL CHANGE Pathology results are CONCORDANT with imaging findings, per Dr. Curtistine Noble. PATHOLOGY revealed: Site 2. Breast, left, needle core biopsy, posterior calcs (coil clip)- ATYPICAL DUCTAL HYPERPLASIA INVOLVING A SMALL  COMPLEX CLOSING LESION, SEE NOTE - CALCIFICATIONS PRESENT Pathology results are CONCORDANT with imaging findings, per Dr. Curtistine Noble. Pathology results and recommendations below were discussed with patient by telephone on 11/26/2023 by Mliss Molt RN. Patient reported biopsy site within normal limits with slight tenderness at the site. Post biopsy care instructions were reviewed, questions were answered and my direct phone number was provided to patient. Patient was instructed to call [facility] if any concerns or questions arise related to the biopsy. RECOMMENDATION: 1. Surgical and oncological consultation. The patient was referred to [facility] with appointment on 12/02/2023. 2. Recommend pretreatment bilateral breast MRI with and without contrast to determine extent of breast disease given diagnosis. Pathology results reported by Mliss Molt RN 11/27/2023. Electronically Signed   By: Curtistine Noble   On: 11/30/2023 08:27   Result Date: 11/30/2023 CLINICAL DATA:  73 year old female here for left breast stereotactic biopsy for indeterminate calcifications. EXAM: LEFT BREAST STEREOTACTIC CORE NEEDLE BIOPSY COMPARISON:  Previous exam(s). FINDINGS: The procedure was discussed with the patient including benefits and alternatives. We discussed the risks of the procedure, including infection, bleeding, tissue injury and inadequate sampling. Post biopsy clip placement and possible clip migration were also discussed. Informed written consent was given. The usual time-out protocol was performed immediately prior to the procedure. Site #1 Left Breast:        Quadrant: Upper outer anterior quadrant Pre-procedural images were obtained and the calcifications were localized which correlates with the area of concern seen on prior imaging studies. This area was targeted for stereotactic guided core needle biopsy. Using  sterile technique and 1% Lidocaine with and without epinephrine as local anesthetic, under stereotactic guidance, a 9 gauge vacuum assisted device was used to perform core needle biopsy of the calcifications using a superior approach. Specimen radiograph was performed showing the targeted calcifications. Next, a Ribbon shaped clip was placed in the sampled area. There were no immediate post-procedure complications. Site #2 Left Breast:        Quadrant: Upper outer posterior quadrant Pre-procedural images were obtained and the calcifications were localized which correlates with the area of concern seen on prior imaging studies. This area was targeted for stereotactic guided core needle biopsy. Using sterile technique and 1% Lidocaine with and without epinephrine as local anesthetic, under stereotactic guidance, a 9 gauge vacuum assisted device was used to perform core needle biopsy of the calcifications using a superior approach. Specimen radiograph was performed showing the targeted calcifications. Next, a coil shaped clip was placed in the sampled area. There were no immediate post-procedure complications. Follow-up 2-view mammogram was performed and dictated separately. IMPRESSION: Two site stereotactic-guided biopsy of the left breast as above. No apparent complications. Electronically Signed: By: Curtistine Noble On: 11/25/2023 09:35   MM CLIP PLACEMENT LEFT Result Date: 11/25/2023 CLINICAL DATA:  73 year old status post left breast 2 site stereotactic biopsy. EXAM: 3D DIAGNOSTIC LEFT MAMMOGRAM  POST STEREOTACTIC BIOPSY COMPARISON:  Previous exam(s). ACR Breast Density Category b: There are scattered areas of fibroglandular density. FINDINGS: 3D Mammographic images were obtained following stereotactic guided biopsy of left breast calcifications. The biopsy marking clips are in expected position at the site of biopsy. IMPRESSION: 1. Appropriate positioning of the ribbon shaped biopsy marking clip at the site  of biopsy in the upper-outer anterior left breast. 2. Appropriate positioning of the coil shaped biopsy marking clip at the site of biopsy in the upper-outer posterior left breast. Final Assessment: Post Procedure Mammograms for Marker Placement Electronically Signed   By: Curtistine Noble   On: 11/25/2023 09:43   MM Digital Diagnostic Unilat L Result Date: 11/24/2023 CLINICAL DATA:  73 year old woman with prior history of RIGHT breast malignancy in 2006 presents for diagnostic evaluation of LEFT breast calcifications. EXAM: DIGITAL DIAGNOSTIC UNILATERAL LEFT MAMMOGRAM WITH CAD TECHNIQUE: Left digital diagnostic mammography was performed. COMPARISON:  Previous exam(s). ACR Breast Density Category b: There are scattered areas of fibroglandular density. FINDINGS: LEFT: Mammogram: Full field ML and CC and spot magnification CC and ML views of the LEFT breast were obtained. Segmentally distributed group of round and amorphous calcifications are seen spanning 8 cm in the upper outer LEFT breast. IMPRESSION: Suspicious, segmentally distributed group of LEFT upper-outer breast calcifications spanning 8 cm. Recommend sampling anterior and posterior extents of this group of calcifications. RECOMMENDATION: Stereotactic guided biopsy of LEFT upper-outer breast calcifications (x2) I have discussed the findings and recommendations with the patient. The biopsy procedure was explained to the patient and questions were answered. Patient expressed their understanding of the biopsy recommendation. Patient will be scheduled for biopsy at her earliest convenience by the schedulers. Ordering provider will be notified. If applicable, a reminder letter will be sent to the patient regarding the next appointment. BI-RADS CATEGORY  4: Suspicious. Electronically Signed   By: Aliene Lloyd M.D.   On: 11/24/2023 08:02   MM 3D SCREENING MAMMOGRAM BILATERAL BREAST Result Date: 11/18/2023 CLINICAL DATA:  Screening. History of a right  lumpectomy for breast carcinoma in 2006. EXAM: DIGITAL SCREENING BILATERAL MAMMOGRAM WITH TOMOSYNTHESIS AND CAD TECHNIQUE: Bilateral screening digital craniocaudal and mediolateral oblique mammograms were obtained. Bilateral screening digital breast tomosynthesis was performed. The images were evaluated with computer-aided detection. COMPARISON:  Previous exam(s). ACR Breast Density Category b: There are scattered areas of fibroglandular density. FINDINGS: In the left breast, calcifications warrant further evaluation. In the right breast, no findings suspicious for malignancy. IMPRESSION: Further evaluation is suggested for calcifications in the left breast. RECOMMENDATION: Diagnostic mammogram of the left breast. (Code:[id number]) The patient will be contacted regarding the findings, and additional imaging will be scheduled. BI-RADS CATEGORY  0: Incomplete: Need additional imaging evaluation. Electronically Signed   By: Alm Parkins M.D.   On: 11/18/2023 09:08      IMPRESSION/PLAN: ***   It was a pleasure meeting the patient today. We discussed the risks, benefits, and side effects of radiotherapy. I recommend radiotherapy to the *** to reduce her risk of locoregional recurrence by 2/3.  We discussed that radiation would take approximately *** weeks to complete and that I would give the patient a few weeks to heal following surgery before starting treatment planning. *** If chemotherapy were to be given, this would precede radiotherapy. We spoke about acute effects including skin irritation and fatigue as well as much less common late effects including internal organ injury or irritation. We spoke about the latest technology that is used to minimize the risk  of late effects for patients undergoing radiotherapy to the breast or chest wall. No guarantees of treatment were given. The patient is enthusiastic about proceeding with treatment. I look forward to participating in the patient's care.  I will await her  referral back to me for postoperative follow-up and eventual CT simulation/treatment planning.  On date of service, in total, I spent *** minutes on this encounter. Patient was seen in person.   __________________________________________   Lauraine Golden, MD  This document serves as a record of services personally performed by Lauraine Golden, MD. It was created on her behalf by Dorthy Fuse, a trained medical scribe. The creation of this record is based on the scribe's personal observations and the provider's statements to them. This document has been checked and approved by the attending provider.

## 2023-12-02 ENCOUNTER — Encounter: Payer: Self-pay | Admitting: General Practice

## 2023-12-02 ENCOUNTER — Ambulatory Visit: Attending: General Surgery | Admitting: Physical Therapy

## 2023-12-02 ENCOUNTER — Ambulatory Visit
Admission: RE | Admit: 2023-12-02 | Discharge: 2023-12-02 | Disposition: A | Discharge status: Home / Self Care | Source: Ambulatory Visit | Attending: Radiation Oncology | Admitting: Radiation Oncology

## 2023-12-02 ENCOUNTER — Ambulatory Visit: Admitting: Radiation Oncology

## 2023-12-02 ENCOUNTER — Other Ambulatory Visit: Payer: Self-pay

## 2023-12-02 ENCOUNTER — Encounter: Payer: Self-pay | Admitting: Physical Therapy

## 2023-12-02 ENCOUNTER — Encounter: Payer: Self-pay | Admitting: *Deleted

## 2023-12-02 ENCOUNTER — Inpatient Hospital Stay (HOSPITAL_BASED_OUTPATIENT_CLINIC_OR_DEPARTMENT_OTHER)

## 2023-12-02 ENCOUNTER — Inpatient Hospital Stay

## 2023-12-02 ENCOUNTER — Encounter: Payer: Self-pay | Admitting: Hematology

## 2023-12-02 ENCOUNTER — Inpatient Hospital Stay: Attending: Hematology | Admitting: Hematology

## 2023-12-02 VITALS — BP 147/66 | HR 80 | Temp 98.1°F | Resp 17 | Ht 63.78 in | Wt 168.0 lb

## 2023-12-02 DIAGNOSIS — C50412 Malignant neoplasm of upper-outer quadrant of left female breast: Secondary | ICD-10-CM

## 2023-12-02 DIAGNOSIS — Z8049 Family history of malignant neoplasm of other genital organs: Secondary | ICD-10-CM | POA: Diagnosis not present

## 2023-12-02 DIAGNOSIS — Z170 Estrogen receptor positive status [ER+]: Secondary | ICD-10-CM | POA: Diagnosis not present

## 2023-12-02 DIAGNOSIS — Z9189 Other specified personal risk factors, not elsewhere classified: Secondary | ICD-10-CM | POA: Insufficient documentation

## 2023-12-02 DIAGNOSIS — M8580 Other specified disorders of bone density and structure, unspecified site: Secondary | ICD-10-CM | POA: Diagnosis not present

## 2023-12-02 DIAGNOSIS — Z87891 Personal history of nicotine dependence: Secondary | ICD-10-CM | POA: Diagnosis not present

## 2023-12-02 DIAGNOSIS — Z853 Personal history of malignant neoplasm of breast: Secondary | ICD-10-CM | POA: Insufficient documentation

## 2023-12-02 DIAGNOSIS — Z923 Personal history of irradiation: Secondary | ICD-10-CM | POA: Diagnosis not present

## 2023-12-02 DIAGNOSIS — Z8249 Family history of ischemic heart disease and other diseases of the circulatory system: Secondary | ICD-10-CM | POA: Diagnosis not present

## 2023-12-02 DIAGNOSIS — Z90722 Acquired absence of ovaries, bilateral: Secondary | ICD-10-CM | POA: Insufficient documentation

## 2023-12-02 DIAGNOSIS — Z90710 Acquired absence of both cervix and uterus: Secondary | ICD-10-CM | POA: Insufficient documentation

## 2023-12-02 DIAGNOSIS — R293 Abnormal posture: Secondary | ICD-10-CM | POA: Insufficient documentation

## 2023-12-02 DIAGNOSIS — Z883 Allergy status to other anti-infective agents status: Secondary | ICD-10-CM | POA: Insufficient documentation

## 2023-12-02 DIAGNOSIS — N6489 Other specified disorders of breast: Secondary | ICD-10-CM | POA: Insufficient documentation

## 2023-12-02 DIAGNOSIS — Z1721 Progesterone receptor positive status: Secondary | ICD-10-CM | POA: Diagnosis not present

## 2023-12-02 DIAGNOSIS — F0390 Unspecified dementia without behavioral disturbance: Secondary | ICD-10-CM | POA: Diagnosis not present

## 2023-12-02 DIAGNOSIS — Z79899 Other long term (current) drug therapy: Secondary | ICD-10-CM | POA: Diagnosis not present

## 2023-12-02 DIAGNOSIS — Z79810 Long term (current) use of selective estrogen receptor modulators (SERMs): Secondary | ICD-10-CM | POA: Insufficient documentation

## 2023-12-02 DIAGNOSIS — C50411 Malignant neoplasm of upper-outer quadrant of right female breast: Secondary | ICD-10-CM | POA: Diagnosis not present

## 2023-12-02 LAB — CBC WITH DIFFERENTIAL (CANCER CENTER ONLY)
Abs Immature Granulocytes: 0.02 K/uL (ref 0.00–0.07)
Basophils Absolute: 0 K/uL (ref 0.0–0.1)
Basophils Relative: 0 %
Eosinophils Absolute: 0 K/uL (ref 0.0–0.5)
Eosinophils Relative: 1 %
HCT: 43.3 % (ref 36.0–46.0)
Hemoglobin: 14.8 g/dL (ref 12.0–15.0)
Immature Granulocytes: 0 %
Lymphocytes Relative: 19 %
Lymphs Abs: 1.2 K/uL (ref 0.7–4.0)
MCH: 30.9 pg (ref 26.0–34.0)
MCHC: 34.2 g/dL (ref 30.0–36.0)
MCV: 90.4 fL (ref 80.0–100.0)
Monocytes Absolute: 0.7 K/uL (ref 0.1–1.0)
Monocytes Relative: 11 %
Neutro Abs: 4.1 K/uL (ref 1.7–7.7)
Neutrophils Relative %: 69 %
Platelet Count: 228 K/uL (ref 150–400)
RBC: 4.79 MIL/uL (ref 3.87–5.11)
RDW: 13.5 % (ref 11.5–15.5)
WBC Count: 6 K/uL (ref 4.0–10.5)
nRBC: 0 % (ref 0.0–0.2)

## 2023-12-02 LAB — CMP (CANCER CENTER ONLY)
ALT: 5 U/L (ref 0–44)
AST: 22 U/L (ref 15–41)
Albumin: 4.3 g/dL (ref 3.5–5.0)
Alkaline Phosphatase: 84 U/L (ref 38–126)
Anion gap: 10 (ref 5–15)
BUN: 27 mg/dL — ABNORMAL HIGH (ref 8–23)
CO2: 26 mmol/L (ref 22–32)
Calcium: 9.9 mg/dL (ref 8.9–10.3)
Chloride: 100 mmol/L (ref 98–111)
Creatinine: 0.85 mg/dL (ref 0.44–1.00)
GFR, Estimated: >60 mL/min (ref >60)
Glucose, Bld: 101 mg/dL — ABNORMAL HIGH (ref 70–99)
Potassium: 4.1 mmol/L (ref 3.5–5.1)
Sodium: 137 mmol/L (ref 135–145)
Total Bilirubin: 0.7 mg/dL (ref 0.0–1.2)
Total Protein: 7 g/dL (ref 6.5–8.1)

## 2023-12-02 LAB — GENETIC SCREENING ORDER

## 2023-12-02 NOTE — Progress Notes (Addendum)
 REFERRING PROVIDER: Ebbie Cough, MD [address] [place],  [place] [address]  PRIMARY PROVIDER:  Janey Santos, MD  PRIMARY REASON FOR VISIT:  1. Malignant neoplasm of upper-outer quadrant of left breast in female, estrogen receptor positive (HCC)   2. Family history of uterine cancer    HISTORY OF PRESENT ILLNESS:   Norma Mayer, a 73 y.o. female, was seen for a [place] cancer genetics consultation at the request of Cough Ebbie, MD due to a personal history of breast cancer.  Norma Mayer presents today the at [facility] to discuss the possibility of a hereditary predisposition to cancer, genetic testing, and to further clarify her future cancer risks, as well as potential cancer risks for family members.  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  CANCER HISTORY:  Oncology History  Invasive ductal carcinoma of breast, female, right (HCC)  2006 Initial Diagnosis   Cancer (HCC)   Malignant neoplasm of upper-outer quadrant of left female breast (HCC)  2006 Initial Diagnosis   Cancer (HCC)   11/25/2023 Cancer Staging   Staging form: Breast, AJCC 8th Edition - Clinical stage from 11/25/2023: cT3, cN0, cM0 - Signed by Lanny Callander, MD on 12/01/2023 Stage prefix: Initial diagnosis   11/30/2023 Initial Diagnosis   Malignant neoplasm of upper-outer quadrant of left female breast (HCC)     RISK FACTORS:  Ovaries intact: no.  Hysterectomy: yes.  Menopausal status: postmenopausal.  Colonoscopy: yes; 07/2022 (2 adenomatous polyps. Mammogram within the last year: yes. Number of breast biopsies: 2. Tobacco Use: Former  Past Medical History:  Diagnosis Date   Arthritis    Breast cancer (HCC) 2006   Right   Cancer (HCC) 2006    BREAST CANCER/infiltrating ductal, ER/PR positive   Cataract    Osteopenia 03/2015   T score -2.4 FRAX 7.5%/0.5%  stable from prior DEXA   Retinal detachment    with lens left eye    Past Surgical History:  Procedure Laterality Date   ABDOMINAL HYSTERECTOMY  2003   TAH,BSO mucinous cystadenoma/leiomyoma/endosalpingosis   BREAST BIOPSY  08/1999   X 2 BREAST BX--ATYPICAL DUCTAL HYPERPLASIA   BREAST BIOPSY Left 11/25/2023   MM LT BREAST BX W LOC DEV 1ST LESION IMAGE BX SPEC STEREO GUIDE 11/25/2023 [facility]   BREAST BIOPSY Left 11/25/2023   MM LT BREAST BX W LOC DEV EA AD LESION IMG BX SPEC STEREO GUIDE 11/25/2023 [facility]   BREAST LUMPECTOMY Right 2006   BREAST CANCER 2006   COLONOSCOPY     EYE SURGERY  2009/2010   RETINA EYE SURGERY   OOPHORECTOMY     BSO   RETINAL DETACHMENT SURGERY  2009-2010   x5   TONSILLECTOMY  1958    Social History   Socioeconomic History   Marital status: Divorced    Spouse name: Not on file   Number of children: 0   Years of education: Not on file   Highest education level: Not on file  Occupational History   Not on file  Tobacco Use   Smoking status: Former   Smokeless tobacco: Never  Vaping Use   Vaping status: Never Used  Substance and Sexual Activity   Alcohol  use: Yes    Alcohol /week: 3.0 standard drinks of alcohol     Types: 3 Glasses of  wine per week    Comment: occ   Drug use: No   Sexual activity: Not Currently    Birth control/protection: Surgical    Comment: 1st intercourse 73 yo-Fewer than 5 partners, hysterectomy  Other Topics Concern   Not on file  Social History Narrative   Not on file   Social Drivers of Health   Financial Resource Strain: Not on file  Food Insecurity: No Food Insecurity (12/02/2023)   Hunger Vital Sign    Worried About Running Out of Food in the Last Year: Never true    Ran Out of Food in the Last Year: Never true  Transportation Needs: No Transportation Needs (12/02/2023)    PRAPARE - Administrator, Civil Service (Medical): No    Lack of Transportation (Non-Medical): No  Physical Activity: Not on file  Stress: Not on file  Social Connections: Not on file     FAMILY HISTORY:  We obtained a detailed, 4-generation family history pasted below.   Norma Mayer is unaware of relatives completing genetic testing for hereditary cancer risks.  There no reported Ashkenazi Jewish ancestry.  There is no known consanguinity.  Significant diagnoses are listed below: Family History  Problem Relation Age of Onset   Uterine cancer Mother        UTERINE   Heart disease Mother    Hypertension Father    Uterine cancer Maternal Aunt    Breast cancer Neg Hx    Colon cancer Neg Hx    Esophageal cancer Neg Hx    Rectal cancer Neg Hx    Stomach cancer Neg Hx    Pedigree Summary Mother - Uterine Cancer dx. 63 Maternal Aunt - Uterine Cancer dx. ? Paternal Aunt - Breast Cancer dx. ? Maternal Grandmother - Leukemia dx.?    GENETIC COUNSELING ASSESSMENT: Norma Mayer is a 73 y.o. female with a personal and family history of breast and uterine cancer which is somewhat suggestive of a hereditary cancer predisposition syndrome. We, therefore, discussed and recommended the following at today's visit.   DISCUSSION: We discussed that, in general, most cancer is not inherited in families, but instead is sporadic or familial. Sporadic cancers occur by chance and typically happen at older ages (>50 years) as this type of cancer is caused by genetic changes acquired during an individual's lifetime. Some families have more cancers than would be expected by chance; however, the ages or types of cancer are not consistent with a known genetic mutation or known genetic mutations have been ruled out. This type of familial cancer is thought to be due to a combination of multiple genetic, environmental, hormonal, and lifestyle factors. While this combination of factors likely  increases the risk of cancer, the exact source of this risk is not currently identifiable or testable.  We discussed that 5-10% of cancer is the result of germline (heritable) genetic variants, with most cases associated with BRCA1/BRCA2. There are other genes that can be associated with hereditary  cancer syndromes, such a Lynch Syndrome. Lynch Syndrome is associated with genetic variants in the MLH1, PMS2, MSH2 and MSH6 genes and associated with increased risk for cancers including uterine and colorectal cancers. We discussed that testing is beneficial for several reasons including knowing how to follow individuals after completing their treatment, identifying whether potential treatment options such as PARP inhibitors would be beneficial, and understanding if other family members could be at risk for cancer and allow them to undergo genetic testing.   We reviewed the  characteristics, features and inheritance patterns of hereditary cancer syndromes. We also discussed genetic testing, including the appropriate family members to test, the process of testing, insurance coverage and turn-around-time for results. We discussed the implications of a negative, positive, carrier and/or variant of uncertain significant result. Norma Mayer  was offered a common hereditary cancer panel (40 genes) and an expanded pan-cancer panel (77 genes). Norma Mayer was informed of the benefits and limitations of each panel, including that expanded pan-cancer panels contain genes that do not have clear management guidelines at this point in time.  We also discussed that as the number of genes included on a panel increases, the chances of variants of uncertain significance increases.  GENETIC TESTING NATIONAL CRITERIA: Based on [facility] personal history of two primary breast cancers she meets medical criteria for genetic testing based on the [employer] (NCCN) guidelines.  Despite that she  meets criteria, she  may still have an out of pocket cost. she completed the [facility] patient assistance form in the office and qualified for $0 out of post cost. We discussed that she will get a text from [employer] with billing formation and her out-of-pocket cost estimate.   GENETIC TESTING CONSENT:  After considering the risks, benefits, and limitations, Norma Mayer provided informed consent to pursue genetic testing. A blood sample was sent to [facility] for analysis of the CancerNext+RNA Panel. Results should be available within approximately 7-10 days' time, at which point they will be disclosed by telephone to Norma Mayer , as will any additional recommendations warranted by these results. Norma Mayer will receive a summary of her genetic counseling visit and a copy of her results once available. This information will also be available in Epic.  [facility] CancerNext + RNAinsight gene panel which includes sequencing, rearrangement analysis, and RNA analysis for the following 40 genes: APC, ATM, BAP1, BARD1, BMPR1A, BRCA1, BRCA2, BRIP1, CDH1, CDKN2A, CHEK2, FH, FLCN, MET, MLH1, MSH2, MSH6, MUTYH, NF1, NTHL1, PALB2, PMS2, PTEN, RAD51C, RAD51D, RPS20, SMAD4, STK11, TP53, TSC1, TSC2, and VHL (sequencing and deletion/duplication); AXIN2, HOXB13, MBD4, MSH3, POLD1 and POLE (sequencing only); EPCAM and GREM1 (deletion/duplication only).  GENETIC INFORMATION NONDISCRIMINATION ACT (GINA): We discussed that some people do not want to undergo genetic testing due to fear of genetic discrimination.  The Genetic Information Nondiscrimination Act (GINA) was signed into federal law in 2008. GINA prohibits health insurers and most employers from discriminating against individuals based on genetic information (including the results of genetic tests and family history information). According to GINA, health insurance companies cannot consider genetic information to be a preexisting condition, nor can they use  it to make decisions regarding coverage or rates. GINA also makes it illegal for most employers to use genetic information in making decisions about hiring, firing, promotion, or terms of employment. It is important to note that GINA does not offer protections for life insurance, disability insurance, or long-term care insurance. GINA does not apply to those in the [employer], those who work for companies with less than 15 employees, and new life insurance or long-term disability insurance policies.  Health status due to a cancer diagnosis is not protected under GINA. More information about GINA can be found by visiting eliteclients.be.  Lastly, we encouraged Norma Mayer to remain in contact with cancer genetics annually so that we can continuously update the family history and inform her of any changes in cancer genetics and testing that may be of benefit for this family.   Norma Mayer  questions were answered to her satisfaction today. Our contact information was provided should additional questions or concerns arise. Thank you for the referral and allowing us  to share in the care of your patient.   Resources:  Norma Mayer was provided with the following:  [facility] Billing information  [facility] Hereditary Cancer Testing Patient Guide [facility] CancerNext + RNAinsight gene list  PLAN:  Testing Ordered: [facility] CancerNext+RNA  Clinic Note Faxed/Routed to [employer] PCP Janey Santos, MD  Vaughn Banker Patient Assistance completed during visit (on paper to be uploaded in the [facility] Portal)  Norma Fryer, MS, CGC  Certified Genetic Counselor  Email: [email or web address]  Phone: [phone]  I personally spent a total of 20 minutes in the care of the patient today including preparing to see the patient, counseling and educating, placing orders, and documenting clinical information in the EHR.  The patient was joined by her brother, Norma Mayer. Drs.  Lanny Stalls, and/or Gudena were available for questions, if needed. _______________________________________________________________________ For Office Staff:  Number of people involved in session: 2 Was an Intern/ student involved with case: no

## 2023-12-02 NOTE — Therapy (Unsigned)
 OUTPATIENT PHYSICAL THERAPY BREAST CANCER BASELINE EVALUATION   Patient Name: Norma Mayer MRN: [id number] DOB:March 19, 1950, 73 y.o., female Today's Date: 12/03/2023  END OF SESSION:  PT End of Session - 12/02/23 1429     Visit Number 1    Number of Visits 2    Date for Recertification  01/27/24    PT Start Time 1348    PT Stop Time 1355   Also saw pt from 1452 to 1520 for a total 35 of min   PT Time Calculation (min) 7 min    Activity Tolerance Patient tolerated treatment well    Behavior During Therapy [facility] for tasks assessed/performed          Past Medical History:  Diagnosis Date   Arthritis    Breast cancer (HCC) 2006   Right   Cancer (HCC) 2006   BREAST CANCER/infiltrating ductal, ER/PR positive   Cataract    Osteopenia 03/2015   T score -2.4 FRAX 7.5%/0.5%  stable from prior DEXA   Retinal detachment    with lens left eye   Past Surgical History:  Procedure Laterality Date   ABDOMINAL HYSTERECTOMY  2003   TAH,BSO mucinous cystadenoma/leiomyoma/endosalpingosis   BREAST BIOPSY  08/1999   X 2 BREAST BX--ATYPICAL DUCTAL HYPERPLASIA   BREAST BIOPSY Left 11/25/2023   MM LT BREAST BX W LOC DEV 1ST LESION IMAGE BX SPEC STEREO GUIDE 11/25/2023 [facility]   BREAST BIOPSY Left 11/25/2023   MM LT BREAST BX W LOC DEV EA AD LESION IMG BX SPEC STEREO GUIDE 11/25/2023 [facility]   BREAST LUMPECTOMY Right 2006   BREAST CANCER 2006   COLONOSCOPY     EYE SURGERY  2009/2010   RETINA EYE SURGERY   OOPHORECTOMY     BSO   RETINAL DETACHMENT SURGERY  2009-2010   x5   TONSILLECTOMY  1958   Patient Active Problem List   Diagnosis Date Noted   Malignant neoplasm of upper-outer quadrant of left female breast (HCC) 11/30/2023   Posterior vitreous detachment of right eye 09/05/2019   Pseudophakia 09/05/2019   History of retinal detachment 09/05/2019   Nuclear sclerotic cataract of right eye 09/05/2019   Primary open angle glaucoma of both eyes, mild  stage 09/05/2019   Cancer (HCC)    Osteopenia 07/09/2010    REFERRING PROVIDER: Dr. Donnice Mayer  REFERRING DIAG: Left breast cancer  THERAPY DIAG:  At risk for lymphedema  Carcinoma of upper-outer quadrant of left female breast, unspecified estrogen receptor status (HCC)  Abnormal posture  Rationale for Evaluation and Treatment: Rehabilitation  ONSET DATE: 11/25/2023  SUBJECTIVE:  SUBJECTIVE STATEMENT: Patient reports she is here today to be seen by her medical team for her newly diagnosed left breast cancer.   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 It measures 8 cm of calcifications and is located in the upper outer quadrant. There was insufficient tissue for a prognostic panel. She has a history of right breast cancer in 2006 treated with a lumpectomy and sentinel node biopsy (1 negative node) and radiation.   PATIENT GOALS:   reduce lymphedema risk and learn post op HEP.   PAIN:  Are you having pain? No  PRECAUTIONS: Active CA   RED FLAGS: None   HAND DOMINANCE: right  WEIGHT BEARING RESTRICTIONS: No  FALLS:  Has patient fallen in last 6 months? 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  COGNITION: Overall cognitive status: Within functional limits for tasks assessed    POSTURE:  Forward head and rounded shoulders posture  UPPER EXTREMITY  AROM/PROM:  A/PROM RIGHT   eval   Shoulder extension 48  Shoulder flexion 141  Shoulder abduction 147  Shoulder internal rotation 75  Shoulder external rotation 81    (Blank rows = not tested)  A/PROM LEFT   eval  Shoulder extension 46  Shoulder flexion 140  Shoulder abduction 145  Shoulder internal rotation 79  Shoulder external rotation 88    (Blank rows = not tested)  CERVICAL AROM: All within normal limits:    Percent limited  Flexion WNL  Extension WNL  Right lateral flexion 25% limited  Left lateral flexion 25% limited  Right rotation WNL  Left rotation WNL    UPPER EXTREMITY STRENGTH: WNL  LYMPHEDEMA ASSESSMENTS (in cm):   LANDMARK RIGHT   eval  10 cm proximal to olecranon process from proximal aspect of olecranon 32.2  Olecranon process 23.5  10 cm proximal to ulnar styloid process from proximal aspect of styloid process 22.5  Just distal to ulnar styloid process 15.7  Across hand at thumb web space 17.2  At base of 2nd digit 5.9  (Blank rows = not tested)  LANDMARK LEFT   eval  10 cm proximal to olecranon process from proximal aspect of olecranon 32   Olecranon process 25.1  10 cm proximal to ulnar styloid process from proximal aspect of styloid process 20.7  Just distal to ulnar styloid process 14.7  Across hand at thumb web space 17.4  At base of 2nd digit 5.8  (Blank rows = not tested)  L-DEX LYMPHEDEMA SCREENING:  The patient was assessed using the L-Dex machine today to produce a lymphedema index baseline score. The patient will be reassessed on a regular basis (typically every 3 months) to obtain new L-Dex scores. If the score is > 6.5 points away from his/her baseline score indicating onset of subclinical lymphedema, it will be recommended to wear a compression garment for 4 weeks, 12 hours per day and then be reassessed. If the score continues to be > 6.5 points from baseline at reassessment, we will initiate lymphedema treatment. Assessing  in this manner has a 95% rate of preventing clinically significant lymphedema.   L-DEX FLOWSHEETS - 12/02/23  1400       L-DEX LYMPHEDEMA SCREENING   Measurement Type Unilateral    L-DEX MEASUREMENT EXTREMITY Upper Extremity    POSITION  Standing    DOMINANT SIDE Right    At Risk Side Left    BASELINE SCORE (UNILATERAL) 2.3          QUICK DASH SURVEY:  Norma Mayer - 12/03/23 0001     Open a tight or new jar Mild difficulty    Do heavy household chores (wash walls, wash floors) No difficulty    Carry a shopping bag or briefcase No difficulty    Wash your back No difficulty    Use a knife to cut food No difficulty    Recreational activities in which you take some force or impact through your arm, shoulder, or hand (golf, hammering, tennis) No difficulty    During the past week, to what extent has your arm, shoulder or hand problem interfered with your normal social activities with family, friends, neighbors, or groups? Not at all    During the past week, to what extent has your arm, shoulder or hand problem limited your work or other regular daily activities Not at all    Arm, shoulder, or hand pain. None    Tingling (pins and needles) in your arm, shoulder, or hand None    Difficulty Sleeping No difficulty    DASH Score 2.27 %           PATIENT EDUCATION:  Education details: Time spent educating patient on aspects of self-care to maximize post op recovery. Patient was educated on where and how to get a post op compression bra to use to reduce post op edema. Patient was also educated on the use of SOZO screenings and surveillance principles for early identification of lymphedema onset. She was instructed to use the post op pillow in the axilla for pressure and pain relief. Patient educated on lymphedema risk reduction and post op shoulder/posture HEP. Person educated: Patient Education method: Explanation, Demonstration, Handout Education comprehension: Patient verbalized  understanding and returned demonstration  HOME EXERCISE PROGRAM: Patient was instructed today in a home exercise program today for post op shoulder range of motion. These included active assist shoulder flexion in sitting, scapular retraction, wall walking with shoulder abduction, and hands behind head external rotation.  She was encouraged to do these twice a day, holding 3 seconds and repeating 5 times when permitted by her physician.   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 It measures 8 cm of calcifications and is located in the upper outer quadrant. There was insufficient tissue for a prognostic panel.Her multidisciplinary medical team met prior to her assessments to determine a recommended treatment plan. She is planning to have a left lumpectomy or mastectomy with a sentinel node biopsy followed by possible Oncotype testing, possible radiation, and possible anti-estrogen therapy - all depending on her prognostic panel on final pathology. She has a history of right breast cancer in 2006 treated with a lumpectomy and sentinel node biopsy (1 negative node) and radiation. She will benefit from a post op PT reassessment to determine needs and from L-Dex screens every 3 months for 2 years to detect subclinical lymphedema.  Pt will benefit from skilled therapeutic intervention to improve on the following deficits: Decreased knowledge of precautions, impaired UE functional use, pain, decreased ROM, postural dysfunction.   PT treatment/interventions: ADL/self-care home management, pt/family education, therapeutic exercise  REHAB POTENTIAL: Excellent  CLINICAL DECISION MAKING: Stable/uncomplicated  EVALUATION COMPLEXITY: Low   GOALS: Goals reviewed with patient? YES  LONG TERM GOALS: (STG=LTG)    Name Target Date Goal status  1 Pt will be able to verbalize understanding of pertinent lymphedema risk  reduction practices relevant to her dx specifically related to skin care.  Baseline:  No knowledge 12/03/2023 Achieved at eval  2 Pt will be able to return demo and/or verbalize understanding of the post op HEP related to regaining shoulder ROM. Baseline:  No knowledge 12/03/2023 Achieved at eval  3 Pt will be able to verbalize understanding of the importance of viewing the post op After Breast CA Class video for further lymphedema risk reduction education and therapeutic exercise.  Baseline:  No knowledge 12/03/2023 Achieved at eval  4 Pt will demo she has regained full shoulder ROM and function post operatively compared to baselines.  Baseline: See objective measurements taken today. 01/27/2024     PLAN:  PT FREQUENCY/DURATION: EVAL and 1 follow up appointment.   PLAN FOR NEXT SESSION: will reassess 3-4 weeks post op to determine needs.   Patient will follow up at outpatient cancer rehab 3-4 weeks following surgery.  If the patient requires physical therapy at that time, a specific plan will be dictated and sent to the referring physician for approval. The patient was educated today on appropriate basic range of motion exercises to begin post operatively and the importance of viewing the After Breast Cancer class video following surgery.  Patient was educated today on lymphedema risk reduction practices as it pertains to recommendations that will benefit the patient immediately following surgery.  She verbalized good understanding.    Physical Therapy Information for After Breast Cancer Surgery/Treatment:  Lymphedema is a swelling condition that you may be at risk for in your arm if you have lymph nodes removed from the armpit area.  After a sentinel node biopsy, the risk is approximately 5-9% and is higher after an axillary node dissection.  There is treatment available for this condition and it is not life-threatening.  Contact your physician or physical therapist with concerns. You may  begin the 4 shoulder/posture exercises (see additional sheet) when permitted by your physician (typically a week after surgery).  If you have drains, you may need to wait until those are removed before beginning range of motion exercises.  A general recommendation is to not lift your arms above shoulder height until drains are removed.  These exercises should be done to your tolerance and gently.  This is not a no pain/no gain type of recovery so listen to your body and stretch into the range of motion that you can tolerate, stopping if you have pain.  If you are having immediate reconstruction, ask your plastic surgeon about doing exercises as he or she may want you to wait. We encourage you to view the After Breast Cancer class video following surgery.  You will learn information related to lymphedema risk, prevention and treatment and additional exercises to regain mobility following surgery.   While undergoing any medical procedure or treatment, try to avoid blood pressure being taken or needle sticks from occurring on the arm on the side of cancer.   This recommendation begins after surgery and continues for the rest of your life.  This may help reduce your risk of getting lymphedema (swelling in your arm). An excellent resource for those seeking information on lymphedema is the National Lymphedema Network's web site. It can be accessed  at www.lymphnet.org If you notice swelling in your hand, arm or breast at any time following surgery (even if it is many years from now), please contact your doctor or physical therapist to discuss this.  Lymphedema can be treated at any time but it is easier for you if it is treated early on.  If you feel like your shoulder motion is not returning to normal in a reasonable amount of time, please contact your surgeon or physical therapist.  [facility] [phone]. [address], [place] [address]  [facility] CLASS After Breast  Cancer Class  After Breast Cancer Class is a specially designed exercise class video to assist you in a safe recover after having breast cancer surgery.  In this video you will learn how to get back to full function whether your drains were just removed or if you had surgery a month ago. The video can be viewed on this page: [email or web address].boyd-meyer.org/ or on YouTube here: https://[email or web address].  Class Goals  Understand specific stretches to improve the flexibility of you chest and shoulder. Learn ways to safely strengthen your upper body and improve your posture. Understand the warning signs of infection and why you may be at risk for an arm infection. Learn about Lymphedema and prevention.  ** You do not need to view this video until after surgery.  Drains should be removed to participate in the recommended exercises on the video.  Patient was instructed today in a home exercise program today for post op shoulder range of motion. These included active assist shoulder flexion in sitting, scapular retraction, wall walking with shoulder abduction, and hands behind head external rotation.  She was encouraged to do these twice a day, holding 3 seconds and repeating 5 times when permitted by her physician.  Eward Wonda Sharps, [place] 12/03/23 8:26 AM

## 2023-12-02 NOTE — Progress Notes (Signed)
 [facility] Spiritual Care Note  Met with Norma Mayer and Norma Mayer in Breast Multidisciplinary Clinic to introduce Support Center team/resources.  She completed SDOH screening; results follow below.    SDOH Screenings   Food Insecurity: No Food Insecurity (12/02/2023)  Housing: Low Risk  (12/02/2023)  Transportation Needs: No Transportation Needs (12/02/2023)  Utilities: Not At Risk (12/02/2023)  Depression (PHQ2-9): Low Risk  (12/02/2023)  Tobacco Use: Medium Risk (12/02/2023)   Chaplain and patient discussed common feelings and emotions when being diagnosed with cancer, and the importance of support during treatment.  Chaplain informed patient of the support team and support services at [facility].  Chaplain provided contact information and encouraged patient to call with any questions or concerns.  Norma Mayer reports that she is feeling some relief after [facility]. She is discerning how to use/guard Norma energy regarding community commitments as she anticipates treatment.  Follow up needed: Yes.  We plan to follow up by phone in ca two weeks for Spiritual Care check-in and to revisit possibility of Sports Coach.  [address] Norma Mayer, [place], [facility] Pager [phone] Voicemail [phone]

## 2023-12-02 NOTE — Progress Notes (Unsigned)
 [facility]   [phone] [phone]   Clinic New Consult Note   Patient Care Team: Janey Santos, MD as PCP - General (Internal Medicine) Tyree Nanetta SAILOR, RN as Oncology Nurse Navigator Ebbie Cough, MD as Consulting Physician (General Surgery) Lanny Callander, MD as Consulting Physician (Hematology) Izell Domino, MD as Attending Physician (Radiation Oncology) 12/02/2023  CHIEF COMPLAINTS/PURPOSE OF CONSULTATION:  Newly diagnosed left breast cancer  REFERRING PHYSICIAN:  [facility]  Discussed the use of AI scribe software for clinical note transcription with the patient, who gave verbal consent to proceed.  History of Present Illness Shauniece Mayer is a 73 year old female with a history of right breast cancer who presents for a new consult regarding a recent diagnosis of left breast cancer.  A recent mammogram revealed a large area of calcification in the left breast measuring about 8 cm, and a core biopsy on November 25, 2023, confirmed invasive mammary carcinoma, grade 2, morphology favor lobular carcinoma, there was insufficient tissue for the prognostic panel testing.  She has a previous diagnosis of stage one breast cancer in the right breast from 2006, treated with lumpectomy and radiation therapy, followed by five years of tamoxifen.  Her family history includes uterine cancer in her mother and maternal aunt. She consumes alcohol  occasionally, typically wine, two to three times a week, and has quit smoking. She lives independently in [place], is divorced, and has no children. 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  She is not on any prescription medications but takes supplements including quercetin, MacuGuard for eye health, and Coenzyme Q10.     MEDICAL HISTORY:  Past Medical History:  Diagnosis Date   Arthritis    Breast cancer (HCC) 2006   Right   Cancer (HCC) 2006   BREAST CANCER/infiltrating  ductal, ER/PR positive   Cataract    Osteopenia 03/2015   T score -2.4 FRAX 7.5%/0.5%  stable from prior DEXA   Retinal detachment    with lens left eye    SURGICAL HISTORY: Past Surgical History:  Procedure Laterality Date   ABDOMINAL HYSTERECTOMY  2003   TAH,BSO mucinous cystadenoma/leiomyoma/endosalpingosis   BREAST BIOPSY  08/1999   X 2 BREAST BX--ATYPICAL DUCTAL HYPERPLASIA   BREAST BIOPSY Left 11/25/2023   MM LT BREAST BX W LOC DEV 1ST LESION IMAGE BX SPEC STEREO GUIDE 11/25/2023 [facility]   BREAST BIOPSY Left 11/25/2023   MM LT BREAST BX W LOC DEV EA AD LESION IMG BX SPEC STEREO GUIDE 11/25/2023 [facility]   BREAST LUMPECTOMY Right 2006   BREAST CANCER 2006   COLONOSCOPY     EYE SURGERY  2009/2010   RETINA EYE SURGERY   OOPHORECTOMY     BSO   RETINAL DETACHMENT SURGERY  2009-2010   x5   TONSILLECTOMY  1958    SOCIAL HISTORY: Social History   Socioeconomic History   Marital status: Divorced    Spouse name: Not on file   Number of children: 0   Years of education: Not on file   Highest education level: Not on file  Occupational History   Not on file  Tobacco Use   Smoking status: Former   Smokeless tobacco: Never  Vaping Use   Vaping status: Never Used  Substance and Sexual Activity   Alcohol  use: Yes    Alcohol /week: 3.0 standard drinks of alcohol     Types: 3 Glasses of wine per week    Comment: occ   Drug  use: No   Sexual activity: Not Currently    Birth control/protection: Surgical    Comment: 1st intercourse 73 yo-Fewer than 5 partners, hysterectomy  Other Topics Concern   Not on file  Social History Narrative   Not on file   Social Drivers of Health   Financial Resource Strain: Not on file  Food Insecurity: No Food Insecurity (12/02/2023)   Hunger Vital Sign    Worried About Running Out of Food in the Last Year: Never true    Ran Out of Food in the Last Year: Never true  Transportation Needs: No Transportation Needs  (12/02/2023)   PRAPARE - Administrator, Civil Service (Medical): No    Lack of Transportation (Non-Medical): No  Physical Activity: Not on file  Stress: Not on file  Social Connections: Not on file  Intimate Partner Violence: Not At Risk (12/02/2023)   Humiliation, Afraid, Rape, and Kick questionnaire    Fear of Current or Ex-Partner: No    Emotionally Abused: No    Physically Abused: No    Sexually Abused: No    FAMILY HISTORY: Family History  Problem Relation Age of Onset   Uterine cancer Mother        UTERINE   Heart disease Mother    Hypertension Father    Uterine cancer Maternal Aunt    Breast cancer Neg Hx    Colon cancer Neg Hx    Esophageal cancer Neg Hx    Rectal cancer Neg Hx    Stomach cancer Neg Hx     ALLERGIES:  is allergic to combigan [brimonidine tartrate-timolol], dorzolamide, ketoconazole, mydriacyl [tropicamide], other, timolol, and belladonna alkaloids.  MEDICATIONS:  Current Outpatient Medications  Medication Sig Dispense Refill   CALCIUM PO Take by mouth.     Cholecalciferol (VITAMIN D  PO) Take by mouth.     clobetasol  ointment (TEMOVATE ) 0.05 % Place on the skin twice a day for 2 weeks for a flare and then twice a week at bedtime for maintenance dosing. 30 g 1   No current facility-administered medications for this visit.    REVIEW OF SYSTEMS:   Constitutional: Denies fevers, chills or abnormal night sweats Eyes: Denies blurriness of vision, double vision or watery eyes Ears, nose, mouth, throat, and face: Denies mucositis or sore throat Respiratory: Denies cough, dyspnea or wheezes Cardiovascular: Denies palpitation, chest discomfort or lower extremity swelling Gastrointestinal:  Denies nausea, heartburn or change in bowel habits Skin: Denies abnormal skin rashes Lymphatics: Denies new lymphadenopathy or easy bruising Neurological:Denies numbness, tingling or new weaknesses Behavioral/Psych: Mood is stable, no new changes  All  other systems were reviewed with the patient and are negative.  PHYSICAL EXAMINATION: ECOG PERFORMANCE STATUS: 1 - Symptomatic but completely ambulatory  Vitals:   12/02/23 1236  BP: (!) 147/66  Pulse: 80  Resp: 17  Temp: 98.1 F (36.7 C)  SpO2: 100%   Filed Weights   12/02/23 1236  Weight: 168 lb (76.2 kg)    GENERAL:alert, no distress and comfortable SKIN: skin color, texture, turgor are normal, no rashes or significant lesions EYES: normal, conjunctiva are pink and non-injected, sclera clear OROPHARYNX:no exudate, no erythema and lips, buccal mucosa, and tongue normal  NECK: supple, thyroid  normal size, non-tender, without nodularity LYMPH:  no palpable lymphadenopathy in the cervical, axillary or inguinal LUNGS: clear to auscultation and percussion with normal breathing effort HEART: regular rate & rhythm and no murmurs and no lower extremity edema ABDOMEN:abdomen soft, non-tender and normal bowel sounds  Musculoskeletal:no cyanosis of digits and no clubbing  PSYCH: alert & oriented x 3 with fluent speech NEURO: no focal motor/sensory deficits Breasts: Breast inspection showed them to be asymmetrical with no nipple discharge, right breast is smaller due to previous surgery and radiation. Palpation of the breasts and axilla revealed no obvious mass that I could appreciate except scar tissue in the right breast, and tenderness in the biopsy site of left breast.  Physical Exam BREAST: Tender lump on left breast, likely related to biopsy.  LABORATORY DATA:  I have reviewed the data as listed    Latest Ref Rng & Units 12/02/2023   12:13 PM 03/08/2010    3:55 PM 09/18/2009    2:10 PM  CBC  WBC 4.0 - 10.5 K/uL 6.0  4.6  5.6   Hemoglobin 12.0 - 15.0 g/dL 85.1  86.4  86.3   Hematocrit 36.0 - 46.0 % 43.3  39.2  39.3   Platelets 150 - 400 K/uL 228  204  206       Latest Ref Rng & Units 12/02/2023   12:13 PM 03/08/2010    3:55 PM 09/18/2009    2:10 PM  CMP  Glucose 70 - 99  mg/dL 898  91  898   BUN 8 - 23 mg/dL 27  15  19    Creatinine 0.44 - 1.00 mg/dL 9.14  9.13  9.08   Sodium 135 - 145 mmol/L 137  136  139   Potassium 3.5 - 5.1 mmol/L 4.1  3.8  4.0   Chloride 98 - 111 mmol/L 100  102  104   CO2 22 - 32 mmol/L 26  30  28    Calcium 8.9 - 10.3 mg/dL 9.9  9.0  9.4   Total Protein 6.5 - 8.1 g/dL 7.0  6.5  6.1   Total Bilirubin 0.0 - 1.2 mg/dL 0.7  0.7  0.4   Alkaline Phos 38 - 126 U/L 84  49  55   AST 15 - 41 U/L 22  23  16    ALT 0 - 44 U/L 5  10  <8      RADIOGRAPHIC STUDIES: I have personally reviewed the radiological images as listed and agreed with the findings in the report. MM LT BREAST BX W LOC DEV 1ST LESION IMAGE BX SPEC STEREO GUIDE Addendum Date: 11/30/2023 ADDENDUM REPORT: 11/30/2023 08:27 ADDENDUM: PATHOLOGY revealed: Site 1. Breast, left, needle core biopsy, anterior calcs - INVASIVE MAMMARY CARCINOMA- OVERALL GRADE: 2 - LYMPHOVASCULAR INVASION: NOT IDENTIFIED - CANCER LENGTH: 2.5 MM - CALCIFICATIONS: PRESENT, WITHIN ADJACENT BREAST PARENCHYMA - OTHER FINDINGS: SMALL COMPLEX SCLEROSING LESION WITH USUAL DUCTAL HYPERPLASIA AND COLUMNAR CELL CHANGE Pathology results are CONCORDANT with imaging findings, per Dr. Curtistine Noble. PATHOLOGY revealed: Site 2. Breast, left, needle core biopsy, posterior calcs (coil clip)- ATYPICAL DUCTAL HYPERPLASIA INVOLVING A SMALL COMPLEX CLOSING LESION, SEE NOTE - CALCIFICATIONS PRESENT Pathology results are CONCORDANT with imaging findings, per Dr. Curtistine Noble. Pathology results and recommendations below were discussed with patient by telephone on 11/26/2023 by Mliss Molt RN. Patient reported biopsy site within normal limits with slight tenderness at the site. Post biopsy care instructions were reviewed, questions were answered and my direct phone number was provided to patient. Patient was instructed to call [facility] if any concerns or questions arise related to the biopsy. RECOMMENDATION:  1. Surgical and oncological consultation. The patient was referred to [facility] with appointment  on 12/02/2023. 2. Recommend pretreatment bilateral breast MRI with and without contrast to determine extent of breast disease given diagnosis. Pathology results reported by Mliss Molt RN 11/27/2023. Electronically Signed   By: Curtistine Noble   On: 11/30/2023 08:27   Result Date: 11/30/2023 CLINICAL DATA:  73 year old female here for left breast stereotactic biopsy for indeterminate calcifications. EXAM: LEFT BREAST STEREOTACTIC CORE NEEDLE BIOPSY COMPARISON:  Previous exam(s). FINDINGS: The procedure was discussed with the patient including benefits and alternatives. We discussed the risks of the procedure, including infection, bleeding, tissue injury and inadequate sampling. Post biopsy clip placement and possible clip migration were also discussed. Informed written consent was given. The usual time-out protocol was performed immediately prior to the procedure. Site #1 Left Breast:        Quadrant: Upper outer anterior quadrant Pre-procedural images were obtained and the calcifications were localized which correlates with the area of concern seen on prior imaging studies. This area was targeted for stereotactic guided core needle biopsy. Using sterile technique and 1% Lidocaine with and without epinephrine as local anesthetic, under stereotactic guidance, a 9 gauge vacuum assisted device was used to perform core needle biopsy of the calcifications using a superior approach. Specimen radiograph was performed showing the targeted calcifications. Next, a Ribbon shaped clip was placed in the sampled area. There were no immediate post-procedure complications. Site #2 Left Breast:        Quadrant: Upper outer posterior quadrant Pre-procedural images were obtained and the calcifications were localized which correlates with the area of concern  seen on prior imaging studies. This area was targeted for stereotactic guided core needle biopsy. Using sterile technique and 1% Lidocaine with and without epinephrine as local anesthetic, under stereotactic guidance, a 9 gauge vacuum assisted device was used to perform core needle biopsy of the calcifications using a superior approach. Specimen radiograph was performed showing the targeted calcifications. Next, a coil shaped clip was placed in the sampled area. There were no immediate post-procedure complications. Follow-up 2-view mammogram was performed and dictated separately. IMPRESSION: Two site stereotactic-guided biopsy of the left breast as above. No apparent complications. Electronically Signed: By: Curtistine Noble On: 11/25/2023 09:35   MM LT BREAST BX W LOC DEV EA AD LESION IMG BX SPEC STEREO GUIDE Addendum Date: 11/30/2023 ADDENDUM REPORT: 11/30/2023 08:27 ADDENDUM: PATHOLOGY revealed: Site 1. Breast, left, needle core biopsy, anterior calcs - INVASIVE MAMMARY CARCINOMA- OVERALL GRADE: 2 - LYMPHOVASCULAR INVASION: NOT IDENTIFIED - CANCER LENGTH: 2.5 MM - CALCIFICATIONS: PRESENT, WITHIN ADJACENT BREAST PARENCHYMA - OTHER FINDINGS: SMALL COMPLEX SCLEROSING LESION WITH USUAL DUCTAL HYPERPLASIA AND COLUMNAR CELL CHANGE Pathology results are CONCORDANT with imaging findings, per Dr. Curtistine Noble. PATHOLOGY revealed: Site 2. Breast, left, needle core biopsy, posterior calcs (coil clip)- ATYPICAL DUCTAL HYPERPLASIA INVOLVING A SMALL COMPLEX CLOSING LESION, SEE NOTE - CALCIFICATIONS PRESENT Pathology results are CONCORDANT with imaging findings, per Dr. Curtistine Noble. Pathology results and recommendations below were discussed with patient by telephone on 11/26/2023 by Mliss Molt RN. Patient reported biopsy site within normal limits with slight tenderness at the site. Post biopsy care instructions were reviewed, questions were answered and my direct phone number was provided to patient. Patient was  instructed to call [facility] if any concerns or questions arise related to the biopsy. RECOMMENDATION: 1. Surgical and oncological consultation. The patient was referred to [facility] with appointment on 12/02/2023. 2. Recommend pretreatment bilateral breast MRI with  and without contrast to determine extent of breast disease given diagnosis. Pathology results reported by Mliss Molt RN 11/27/2023. Electronically Signed   By: Curtistine Noble   On: 11/30/2023 08:27   Result Date: 11/30/2023 CLINICAL DATA:  73 year old female here for left breast stereotactic biopsy for indeterminate calcifications. EXAM: LEFT BREAST STEREOTACTIC CORE NEEDLE BIOPSY COMPARISON:  Previous exam(s). FINDINGS: The procedure was discussed with the patient including benefits and alternatives. We discussed the risks of the procedure, including infection, bleeding, tissue injury and inadequate sampling. Post biopsy clip placement and possible clip migration were also discussed. Informed written consent was given. The usual time-out protocol was performed immediately prior to the procedure. Site #1 Left Breast:        Quadrant: Upper outer anterior quadrant Pre-procedural images were obtained and the calcifications were localized which correlates with the area of concern seen on prior imaging studies. This area was targeted for stereotactic guided core needle biopsy. Using sterile technique and 1% Lidocaine with and without epinephrine as local anesthetic, under stereotactic guidance, a 9 gauge vacuum assisted device was used to perform core needle biopsy of the calcifications using a superior approach. Specimen radiograph was performed showing the targeted calcifications. Next, a Ribbon shaped clip was placed in the sampled area. There were no immediate post-procedure complications. Site #2 Left Breast:        Quadrant: Upper outer  posterior quadrant Pre-procedural images were obtained and the calcifications were localized which correlates with the area of concern seen on prior imaging studies. This area was targeted for stereotactic guided core needle biopsy. Using sterile technique and 1% Lidocaine with and without epinephrine as local anesthetic, under stereotactic guidance, a 9 gauge vacuum assisted device was used to perform core needle biopsy of the calcifications using a superior approach. Specimen radiograph was performed showing the targeted calcifications. Next, a coil shaped clip was placed in the sampled area. There were no immediate post-procedure complications. Follow-up 2-view mammogram was performed and dictated separately. IMPRESSION: Two site stereotactic-guided biopsy of the left breast as above. No apparent complications. Electronically Signed: By: Curtistine Noble On: 11/25/2023 09:35   MM CLIP PLACEMENT LEFT Result Date: 11/25/2023 CLINICAL DATA:  73 year old status post left breast 2 site stereotactic biopsy. EXAM: 3D DIAGNOSTIC LEFT MAMMOGRAM POST STEREOTACTIC BIOPSY COMPARISON:  Previous exam(s). ACR Breast Density Category b: There are scattered areas of fibroglandular density. FINDINGS: 3D Mammographic images were obtained following stereotactic guided biopsy of left breast calcifications. The biopsy marking clips are in expected position at the site of biopsy. IMPRESSION: 1. Appropriate positioning of the ribbon shaped biopsy marking clip at the site of biopsy in the upper-outer anterior left breast. 2. Appropriate positioning of the coil shaped biopsy marking clip at the site of biopsy in the upper-outer posterior left breast. Final Assessment: Post Procedure Mammograms for Marker Placement Electronically Signed   By: Curtistine Noble   On: 11/25/2023 09:43   MM Digital Diagnostic Unilat L Result Date: 11/24/2023 CLINICAL DATA:  73 year old woman with prior history of RIGHT breast malignancy in 2006  presents for diagnostic evaluation of LEFT breast calcifications. EXAM: DIGITAL DIAGNOSTIC UNILATERAL LEFT MAMMOGRAM WITH CAD TECHNIQUE: Left digital diagnostic mammography was performed. COMPARISON:  Previous exam(s). ACR Breast Density Category b: There are scattered areas of fibroglandular density. FINDINGS: LEFT: Mammogram: Full field ML and CC and spot magnification CC and ML views of the LEFT breast were obtained. Segmentally distributed group of round and amorphous calcifications are seen spanning 8 cm in the  upper outer LEFT breast. IMPRESSION: Suspicious, segmentally distributed group of LEFT upper-outer breast calcifications spanning 8 cm. Recommend sampling anterior and posterior extents of this group of calcifications. RECOMMENDATION: Stereotactic guided biopsy of LEFT upper-outer breast calcifications (x2) I have discussed the findings and recommendations with the patient. The biopsy procedure was explained to the patient and questions were answered. Patient expressed their understanding of the biopsy recommendation. Patient will be scheduled for biopsy at her earliest convenience by the schedulers. Ordering provider will be notified. If applicable, a reminder letter will be sent to the patient regarding the next appointment. BI-RADS CATEGORY  4: Suspicious. Electronically Signed   By: Aliene Lloyd M.D.   On: 11/24/2023 08:02   MM 3D SCREENING MAMMOGRAM BILATERAL BREAST Result Date: 11/18/2023 CLINICAL DATA:  Screening. History of a right lumpectomy for breast carcinoma in 2006. EXAM: DIGITAL SCREENING BILATERAL MAMMOGRAM WITH TOMOSYNTHESIS AND CAD TECHNIQUE: Bilateral screening digital craniocaudal and mediolateral oblique mammograms were obtained. Bilateral screening digital breast tomosynthesis was performed. The images were evaluated with computer-aided detection. COMPARISON:  Previous exam(s). ACR Breast Density Category b: There are scattered areas of fibroglandular density. FINDINGS: In the  left breast, calcifications warrant further evaluation. In the right breast, no findings suspicious for malignancy. IMPRESSION: Further evaluation is suggested for calcifications in the left breast. RECOMMENDATION: Diagnostic mammogram of the left breast. (Code:[id number]) The patient will be contacted regarding the findings, and additional imaging will be scheduled. BI-RADS CATEGORY  0: Incomplete: Need additional imaging evaluation. Electronically Signed   By: Alm Parkins M.D.   On: 11/18/2023 09:08     Assessment & Plan Invasive lobular carcinoma of left breast, ER/PR/HER2 pending, (+) ADH Newly diagnosed invasive lobular carcinoma of the left breast, identified on mammogram as a large area of calcification measuring 8 [place]. Biopsy confirmed invasive mammary carcinoma, morphology favor lobular carcinoma, grade 2, indicating intermediate aggressiveness. The tumor is approximately 8 cm. Prognostic markers (estrogen receptor, progesterone receptor, HER2) was not done due to insufficient biopsy tissue.  -Surgery is the first step in management, followed by further testing to determine the need for chemotherapy, hormone therapy, or radiation. The prognosis may be better if the invasive component is smaller than the total 8 cm area. - Will schedule breast MRI to assess the extent of the tumor and lymph node involvement. - Will proceed with surgical excision of the tumor with Dr. Ebbie, likely lumpectomy if no additional disease on the MRI. - Will perform post-surgical testing for estrogen receptor, progesterone receptor, and HER2 status. - Will determine the need for chemotherapy, hormone therapy, or radiation based on post-surgical test results and her staging. - Will coordinate with Nanetta, the patient navigator, for scheduling and follow-up. -Patient was seen by radiation oncologist Dr. Izell today  History of right breast cancer, status post lumpectomy and radiation Right breast cancer  diagnosed in 2006, treated with lumpectomy, radiation, and tamoxifen.  Plan - Imaging and biopsy results reviewed, unfortunately prognostic panel was not done due to insufficient tissue - She will have bilateral breast MRI, then proceeded with breast surgery first. - Will test ER, PR and HER2 on her surgical sample, and determine if needs Oncotype, or chemotherapy, and or adjuvant antiestrogen therapy.   No orders of the defined types were placed in this encounter.   All questions were answered. The patient knows to call the clinic with any problems, questions or concerns. I spent 45 minutes counseling the patient face to face. The total time spent in the appointment was  50 minutes including review of chart and various tests results, discussions about plan of care and coordination of care plan.     Onita Mattock, MD 12/02/2023 5:05 PM

## 2023-12-02 NOTE — Progress Notes (Addendum)
 REFERRING PHYSICIAN:  Ileana Babara Rushie Steffan, MD PROVIDER:  DONNICE CARLIN BURY, MD MRN: [id number] DOB: Aug 10, 1950 DATE OF ENCOUNTER: 12/02/2023 Subjective    Chief Complaint: Breast Cancer   History of Present Illness: 35 yof who has history of right breast cancer in 2006 treated with lumpectomy/radiotherapy and tamoxifen for five years.  She has been well and then had recent screening mm that shows 8 cm of UOQ left breast calcifications.  The clip from biopsying the ant and posterior extent are 6 cm apart.  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  There is insufficent tissue for prog panel or ecadherin.  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  She has no mass or dc.  She is here with her brother. She is here to discuss options.     Review of Systems: A complete review of systems was obtained from the patient.  I have reviewed this information and discussed as appropriate with the patient.  See HPI as well for other ROS.  Review of Systems  All other systems reviewed and are negative.    Medical History: Past Medical History:  Diagnosis Date   Arthritis    History of cancer     Patient Active Problem List  Diagnosis   Cancer (CMS/HHS-HCC)   History of primary malignant neoplasm of breast   Malignant neoplasm of unspecified site of right female breast (CMS/HHS-HCC)    Past Surgical History:  Procedure Laterality Date   .Left Breast Biopsy Left 11/25/2023   HYSTERECTOMY N/A    abdominal hysterectomy 2003   Right breast lumpectomy Right    2006     Allergies  Allergen Reactions   Dorzolamide Other (See Comments) and Nausea And Vomiting   Ketoconazole Other (See Comments) and Nausea And Vomiting    headache   Timolol Other (See Comments), Nausea And Vomiting and Unknown   Tropicamide Other (See Comments) and Nausea And Vomiting   Adhesive Rash    Current Outpatient Medications on File Prior to Visit  Medication Sig  Dispense Refill   calcium carbonate-vitamin D3 (OS-CAL 500+D) 500 mg-5 mcg (200 unit) tablet Take 1 tablet by mouth once daily     No current facility-administered medications on file prior to visit.    Family History  Problem Relation Age of Onset   Coronary Artery Disease (Blocked arteries around heart) Mother    Hyperlipidemia (Elevated cholesterol) Father      Social History   Tobacco Use  Smoking Status Never  Smokeless Tobacco Never     Social History   Socioeconomic History   Marital status: Divorced  Tobacco Use   Smoking status: Never   Smokeless tobacco: Never  Vaping Use   Vaping status: Never Used  Substance and Sexual Activity   Drug use: Never   Social Drivers of Health   Housing Stability: Unknown (12/02/2023)   Housing Stability Vital Sign    Homeless in the Last Year: No    Objective:  There were no vitals filed for this visit.  There is no height or weight on file to calculate BMI.  Physical Exam Vitals reviewed.  Constitutional:      Appearance: Normal appearance.  Chest:  Breasts:    Right: No inverted nipple, mass or nipple discharge.     Left: No inverted nipple, mass or nipple discharge.     Comments: Right breast with deformity after prior surgery Lymphadenopathy:     Upper Body:  Right upper body: No supraclavicular or axillary adenopathy.     Left upper body: No supraclavicular or axillary adenopathy.  Neurological:     Mental Status: She is alert.        Assessment and Plan:     Left breast cancer   Lumpectomy vs mastectomy, sn biopsy Will get MR prior to final decision.  We discussed the staging and pathophysiology of breast cancer. We discussed all of the different options for treatment for breast cancer including surgery, chemotherapy, radiation therapy,  and antiestrogen therapy.  We discussed a sentinel lymph node biopsy as she does not appear to having lymph node involvement right now. We discussed the  performance of that with injection of magtrace. We discussed that there is a chance of having a positive node with a sentinel lymph node biopsy and we will await the permanent pathology to make any other first further decisions in terms of her treatment. We discussed up to a 5% risk lifetime of chronic shoulder pain as well as lymphedema associated with a sentinel lymph node biopsy.  We discussed the options for treatment of the breast cancer which included lumpectomy versus a mastectomy. We discussed the performance of the lumpectomy with radioactive seed placement. We discussed a 5-10% chance of a positive margin requiring reexcision in the operating room. We also discussed that she will likely need radiation therapy if she undergoes lumpectomy.  We discussed mastectomy and the postoperative care for that as well. Mastectomy can be followed by reconstruction. The decision for lumpectomy vs mastectomy has no impact on decision for chemotherapy. Most mastectomy patients will not need radiation therapy. We discussed that there is no difference in her survival whether she undergoes lumpectomy with radiation therapy or antiestrogen therapy versus a mastectomy. There is also no real difference between her recurrence in the breast. I think if not more disease a lumpectomy could be done and remove this ray of tissue in the uoq and rotate it to give reasonable cosmetic outcome. This breast is larger than other side now.  If has alot more disease then will just plan mastectomy hence the MR first.  We will talk again after MR.     MATTHEW CARLIN BURY, MD

## 2023-12-03 ENCOUNTER — Encounter: Payer: Self-pay | Admitting: General Surgery

## 2023-12-03 ENCOUNTER — Encounter: Payer: Self-pay | Admitting: Physical Therapy

## 2023-12-04 ENCOUNTER — Ambulatory Visit
Admission: RE | Admit: 2023-12-04 | Discharge: 2023-12-04 | Disposition: A | Discharge status: Home / Self Care | Source: Ambulatory Visit | Attending: General Surgery | Admitting: General Surgery

## 2023-12-04 DIAGNOSIS — C50412 Malignant neoplasm of upper-outer quadrant of left female breast: Secondary | ICD-10-CM

## 2023-12-04 DIAGNOSIS — Z1239 Encounter for other screening for malignant neoplasm of breast: Secondary | ICD-10-CM | POA: Diagnosis not present

## 2023-12-04 DIAGNOSIS — Z8049 Family history of malignant neoplasm of other genital organs: Secondary | ICD-10-CM | POA: Insufficient documentation

## 2023-12-04 MED ORDER — GADOPICLENOL 0.5 MMOL/ML IV SOLN
7.5000 mL | Freq: Once | INTRAVENOUS | Status: AC | PRN
  Administered 2023-12-04: 7.5 mL via INTRAVENOUS

## 2023-12-07 DIAGNOSIS — H2000 Unspecified acute and subacute iridocyclitis: Secondary | ICD-10-CM | POA: Diagnosis not present

## 2023-12-08 ENCOUNTER — Telehealth: Payer: Self-pay | Admitting: *Deleted

## 2023-12-08 ENCOUNTER — Other Ambulatory Visit: Payer: Self-pay | Admitting: General Surgery

## 2023-12-08 ENCOUNTER — Encounter: Payer: Self-pay | Admitting: *Deleted

## 2023-12-08 NOTE — Telephone Encounter (Signed)
 Spoke with patient to follow up from [facility] 11/19 and assess navigation needs. Patient denies any questions or concerns at this time. Encouraged her to call should anything arise.

## 2023-12-14 ENCOUNTER — Other Ambulatory Visit: Payer: Self-pay

## 2023-12-14 ENCOUNTER — Encounter (HOSPITAL_BASED_OUTPATIENT_CLINIC_OR_DEPARTMENT_OTHER): Payer: Self-pay | Admitting: General Surgery

## 2023-12-15 ENCOUNTER — Encounter: Payer: Self-pay | Admitting: *Deleted

## 2023-12-16 ENCOUNTER — Telehealth: Payer: Self-pay

## 2023-12-16 DIAGNOSIS — Z1379 Encounter for other screening for genetic and chromosomal anomalies: Secondary | ICD-10-CM | POA: Insufficient documentation

## 2023-12-16 MED ORDER — CHLORHEXIDINE GLUCONATE CLOTH 2 % EX PADS: 6.0000 | MEDICATED_PAD | Freq: Once | CUTANEOUS | Status: DC

## 2023-12-16 MED ORDER — ENSURE PRE-SURGERY PO LIQD: 296.0000 mL | Freq: Once | ORAL | Status: DC

## 2023-12-16 NOTE — Telephone Encounter (Signed)
 I contacted  Norma Mayer to discuss her genetic testing results. No pathogenic variants were identified in the 40 genes analyzed. Discussed that we do not know why she has cancer or why there is cancer in the family. It could be due to a different gene that we are not testing, or maybe our current technology may not be able to pick something up.  It will be important for her to keep in contact with genetics to keep up with whether additional testing may be needed.Detailed clinic note to follow.   The test report will be scanned into EPIC and will be located under the Molecular Pathology section of the Results Review tab.  A portion of the result report is included below for reference.

## 2023-12-16 NOTE — Progress Notes (Signed)

## 2023-12-17 ENCOUNTER — Ambulatory Visit: Payer: Self-pay | Admitting: Genetic Counselor

## 2023-12-17 NOTE — Progress Notes (Signed)
 HPI:  Norma Mayer was previously seen in the [facility] due to a personal and family history of cancer and concerns regarding a hereditary predisposition to cancer. Please refer to our prior cancer genetics clinic note for more information regarding our discussion, assessment and recommendations, at the time. Norma Mayer recent genetic test results were disclosed to her, as were recommendations warranted by these results. These results and recommendations are discussed in more detail below.  Results were disclosed by telephone on 12/16/23 by [facility] assistant Eva.   CANCER HISTORY:  Oncology History  Invasive ductal carcinoma of breast, female, right (HCC)  2006 Initial Diagnosis   Cancer (HCC)   Malignant neoplasm of upper-outer quadrant of left female breast (HCC)  2006 Initial Diagnosis   Cancer (HCC)   11/25/2023 Cancer Staging   Staging form: Breast, AJCC 8th Edition - Clinical stage from 11/25/2023: cT3, cN0, cM0 - Signed by Lanny Callander, MD on 12/01/2023 Stage prefix: Initial diagnosis   11/30/2023 Initial Diagnosis   Malignant neoplasm of upper-outer quadrant of left female breast (HCC)   12/12/2023 Genetic Testing   Negative genetic testing. Report date is 12/12/2023.  The [facility] CancerNext+RNAinsight Panel includes sequencing, rearrangement analysis, and RNA analysis for the following 40 genes: APC, ATM, BAP1, BARD1, BMPR1A, BRCA1, BRCA2, BRIP1, CDH1, CDKN2A, CHEK2, FH, FLCN, MET, MLH1, MSH2, MSH6, MUTYH, NF1, NTHL1, PALB2, PMS2, PTEN, RAD51C, RAD51D, RPS20, SMAD4, STK11, TP53, TSC1, TSC2 and VHL (sequencing and deletion/duplication); AXIN2, HOXB13, MBD4, MSH3, POLD1 and POLE (sequencing only); EPCAM and GREM1 (deletion/duplication only). RNA data is routinely analyzed for use in variant interpretation for all genes.      FAMILY HISTORY:  We obtained a detailed, 4-generation family history.  Significant diagnoses are listed below: Family History  Problem  Relation Age of Onset   Uterine cancer Mother 50   Heart disease Mother    Hypertension Father    Skin cancer Brother        non-melanoma   Leukemia Maternal Grandmother    Uterine cancer Maternal Aunt 7 - 89   Skin cancer Maternal Cousin        non-melanoma   Breast cancer Paternal Aunt    Colon cancer Neg Hx    Esophageal cancer Neg Hx    Rectal cancer Neg Hx    Stomach cancer Neg Hx     Pedigree Summary Mother - Uterine Cancer dx. 63 Maternal Aunt - Uterine Cancer dx. ? Paternal Aunt - Breast Cancer dx. ? Maternal Grandmother - Leukemia dx.?     GENETIC TEST RESULTS: Genetic testing reported out on 12/12/23 through the [facility] Cancer[facility] +RNA panel found no pathogenic mutations. The [facility] CancerNext+RNAinsight Panel includes sequencing, rearrangement analysis, and RNA analysis for the following 40 genes: APC, ATM, BAP1, BARD1, BMPR1A, BRCA1, BRCA2, BRIP1, CDH1, CDKN2A, CHEK2, FH, FLCN, MET, MLH1, MSH2, MSH6, MUTYH, NF1, NTHL1, PALB2, PMS2, PTEN, RAD51C, RAD51D, RPS20, SMAD4, STK11, TP53, TSC1, TSC2 and VHL (sequencing and deletion/duplication); AXIN2, HOXB13, MBD4, MSH3, POLD1 and POLE (sequencing only); EPCAM and GREM1 (deletion/duplication only). The test report has been scanned into EPIC and is located under the Molecular Pathology section of the Results Review tab.  A portion of the result report is included below for reference.     As current genetic testing is not perfect, it is possible there may be a gene mutation in one of these genes that current testing cannot detect, but that chance is small.  We also discussed, that there could be another gene  that has not yet been discovered, or that we have not yet tested, that is responsible for the cancer diagnoses in the family. It is also possible there is a hereditary cause for the cancer in the family that Norma Mayer did not inherit and therefore was not identified in her testing. Therefore, it is important to remain in touch with  cancer genetics in the future so that we can continue to offer Ms. Levier the most up to date genetic testing.   ADDITIONAL GENETIC TESTING: There are other genes that are associated with increased cancer risk that can be analyzed. Should Norma Mayer wish to pursue additional genetic testing, we are happy to discuss and coordinate this testing, at any time.    CANCER SCREENING RECOMMENDATIONS: Ms. Percival test result is considered negative (normal).  This means that we have not identified a hereditary cause for her personal and family history of cancer at this time. Most cancers happen by chance and this negative test suggests that her personal and family history of cancer may fall into this category.    Possible reasons for Norma Mayer's negative genetic test include:  1. There may be a gene mutation in one of these genes that current testing methods cannot detect. The likelihood of this is low, but possible.   2. There could be another gene that has not yet been discovered, or that we have not yet tested, that is responsible for the cancer diagnoses in the family.  3.  There may be no hereditary risk for cancer in the family. The cancers in Norma Mayer and/or her family may be sporadic/familial or due to other genetic and environmental factors. 4. It is also possible there is a hereditary cause for the cancer in the family that Norma Mayer did not inherit.  Therefore, it is recommended she continue to follow the cancer management and screening guidelines provided by her oncology and primary healthcare provider. An individual's cancer risk and medical management are not determined by genetic test results alone. Overall cancer risk assessment incorporates additional factors, including personal medical history, family history, and any available genetic information that may result in a personalized plan for cancer prevention and surveillance  Given Norma Mayer's personal and family histories, we must interpret  these negative results with some caution.  Families with features suggestive of hereditary risk for cancer tend to have multiple family members with cancer, diagnoses in multiple generations and diagnoses before the age of 56. Norma Mayer family exhibits some of these features. Thus, this result may simply reflect our current inability to detect all mutations within these genes or there may be a different gene that has not yet been discovered or tested.    RECOMMENDATIONS FOR FAMILY MEMBERS:  Individuals in this family might be at some increased risk of developing cancer, over the general population risk, simply due to the family history of cancer.  We recommended women in this family have a yearly mammogram beginning at age 31, or 72 years younger than the earliest onset of cancer, an annual clinical breast exam, and perform monthly breast self-exams. Women in this family should also have a gynecological exam as recommended by their primary provider. All family members should be referred for colonoscopy starting at age 85, or 89 years younger than the earliest onset of cancer.  Other relatives may benefit from completing their own genetic testing, especially if they have been diagnosed with cancer.   FOLLOW-UP: Cancer genetics is a rapidly advancing field and it  is possible that new genetic tests will be appropriate for Ms Mayer and/or her family members in the future. It is encouraged to remain in contact with cancer genetics on an annual basis so we can update her personal and family histories and let her know of advances in cancer genetics that may benefit this family.   Our contact number was provided. Norma Mayer welcome to call us  at anytime with additional questions or concerns.   Burnard Ogren, MS, [facility] Licensed, [email or web address] [phone]

## 2023-12-21 ENCOUNTER — Ambulatory Visit (HOSPITAL_BASED_OUTPATIENT_CLINIC_OR_DEPARTMENT_OTHER): Admitting: Anesthesiology

## 2023-12-21 ENCOUNTER — Encounter (HOSPITAL_BASED_OUTPATIENT_CLINIC_OR_DEPARTMENT_OTHER): Payer: Self-pay | Admitting: General Surgery

## 2023-12-21 ENCOUNTER — Encounter (HOSPITAL_BASED_OUTPATIENT_CLINIC_OR_DEPARTMENT_OTHER): Admission: RE | Disposition: A | Payer: Self-pay | Attending: General Surgery

## 2023-12-21 ENCOUNTER — Other Ambulatory Visit: Payer: Self-pay

## 2023-12-21 ENCOUNTER — Observation Stay (HOSPITAL_BASED_OUTPATIENT_CLINIC_OR_DEPARTMENT_OTHER)
Admission: RE | Admit: 2023-12-21 | Discharge: 2023-12-22 | Disposition: A | Attending: General Surgery | Admitting: General Surgery

## 2023-12-21 DIAGNOSIS — Z9012 Acquired absence of left breast and nipple: Principal | ICD-10-CM

## 2023-12-21 DIAGNOSIS — G8918 Other acute postprocedural pain: Secondary | ICD-10-CM | POA: Diagnosis not present

## 2023-12-21 DIAGNOSIS — C50412 Malignant neoplasm of upper-outer quadrant of left female breast: Secondary | ICD-10-CM | POA: Diagnosis not present

## 2023-12-21 DIAGNOSIS — C50912 Malignant neoplasm of unspecified site of left female breast: Secondary | ICD-10-CM | POA: Diagnosis not present

## 2023-12-21 HISTORY — PX: MASTECTOMY W/ SENTINEL NODE BIOPSY: SHX2001

## 2023-12-21 SURGERY — MASTECTOMY WITH SENTINEL LYMPH NODE BIOPSY
Anesthesia: General | Site: Breast | Laterality: Left

## 2023-12-21 MED ORDER — PROPOFOL 10 MG/ML IV BOLUS
INTRAVENOUS | Status: AC
  Filled 2023-12-21: qty 20

## 2023-12-21 MED ORDER — DEXAMETHASONE SOD PHOSPHATE PF 10 MG/ML IJ SOLN
INTRAMUSCULAR | Status: DC | PRN
  Administered 2023-12-21: 10 mg via INTRAVENOUS

## 2023-12-21 MED ORDER — FENTANYL CITRATE (PF) 100 MCG/2ML IJ SOLN
25.0000 ug | INTRAMUSCULAR | Status: DC | PRN
  Administered 2023-12-21 (×2): 25 ug via INTRAVENOUS
  Administered 2023-12-21: 50 ug via INTRAVENOUS

## 2023-12-21 MED ORDER — LIDOCAINE HCL (CARDIAC) PF 100 MG/5ML IV SOSY
PREFILLED_SYRINGE | INTRAVENOUS | Status: DC | PRN
  Administered 2023-12-21: 40 mg via INTRAVENOUS

## 2023-12-21 MED ORDER — ACETAMINOPHEN 500 MG PO TABS
1000.0000 mg | ORAL_TABLET | Freq: Four times a day (QID) | ORAL | Status: DC
  Administered 2023-12-21 – 2023-12-22 (×2): 1000 mg via ORAL
  Filled 2023-12-21 (×2): qty 2

## 2023-12-21 MED ORDER — MIDAZOLAM HCL (PF) 2 MG/2ML IJ SOLN
1.0000 mg | Freq: Once | INTRAMUSCULAR | Status: AC
  Administered 2023-12-21: 1 mg via INTRAVENOUS

## 2023-12-21 MED ORDER — FENTANYL CITRATE (PF) 100 MCG/2ML IJ SOLN
INTRAMUSCULAR | Status: AC
  Filled 2023-12-21: qty 2

## 2023-12-21 MED ORDER — PHENYLEPHRINE 80 MCG/ML (10ML) SYRINGE FOR IV PUSH (FOR BLOOD PRESSURE SUPPORT)
PREFILLED_SYRINGE | INTRAVENOUS | Status: AC
  Filled 2023-12-21: qty 10

## 2023-12-21 MED ORDER — ACETAMINOPHEN 10 MG/ML IV SOLN: 1000.0000 mg | Freq: Once | INTRAVENOUS | Status: DC | PRN

## 2023-12-21 MED ORDER — PROPOFOL 10 MG/ML IV BOLUS
INTRAVENOUS | Status: DC | PRN
  Administered 2023-12-21: 50 mg via INTRAVENOUS
  Administered 2023-12-21: 150 mg via INTRAVENOUS
  Administered 2023-12-21: 50 mg via INTRAVENOUS
  Administered 2023-12-21: 100 mg via INTRAVENOUS

## 2023-12-21 MED ORDER — SODIUM CHLORIDE 0.9 % IV SOLN: INTRAVENOUS | Status: DC

## 2023-12-21 MED ORDER — ONDANSETRON HCL 4 MG/2ML IJ SOLN: 4.0000 mg | Freq: Four times a day (QID) | INTRAMUSCULAR | Status: DC | PRN

## 2023-12-21 MED ORDER — DROPERIDOL 2.5 MG/ML IJ SOLN: 0.6250 mg | Freq: Once | INTRAMUSCULAR | Status: DC | PRN

## 2023-12-21 MED ORDER — OXYCODONE HCL 5 MG PO TABS: 5.0000 mg | ORAL_TABLET | Freq: Once | ORAL | Status: DC | PRN

## 2023-12-21 MED ORDER — PHENYLEPHRINE HCL (PRESSORS) 10 MG/ML IV SOLN
INTRAVENOUS | Status: DC | PRN
  Administered 2023-12-21: 80 ug via INTRAVENOUS

## 2023-12-21 MED ORDER — EPHEDRINE SULFATE (PRESSORS) 25 MG/5ML IV SOSY
PREFILLED_SYRINGE | INTRAVENOUS | Status: DC | PRN
  Administered 2023-12-21: 5 mg via INTRAVENOUS

## 2023-12-21 MED ORDER — CEFAZOLIN SODIUM-DEXTROSE 2-4 GM/100ML-% IV SOLN
INTRAVENOUS | Status: AC
  Filled 2023-12-21: qty 100

## 2023-12-21 MED ORDER — BUPIVACAINE-EPINEPHRINE (PF) 0.5% -1:200000 IJ SOLN
INTRAMUSCULAR | Status: DC | PRN
  Administered 2023-12-21: 30 mL

## 2023-12-21 MED ORDER — SIMETHICONE 80 MG PO CHEW: 40.0000 mg | CHEWABLE_TABLET | Freq: Four times a day (QID) | ORAL | Status: DC | PRN

## 2023-12-21 MED ORDER — ONDANSETRON HCL 4 MG/2ML IJ SOLN
INTRAMUSCULAR | Status: DC | PRN
  Administered 2023-12-21: 4 mg via INTRAVENOUS

## 2023-12-21 MED ORDER — FENTANYL CITRATE (PF) 100 MCG/2ML IJ SOLN
50.0000 ug | Freq: Once | INTRAMUSCULAR | Status: AC
  Administered 2023-12-21: 50 ug via INTRAVENOUS

## 2023-12-21 MED ORDER — EPHEDRINE 5 MG/ML INJ
INTRAVENOUS | Status: AC
  Filled 2023-12-21: qty 5

## 2023-12-21 MED ORDER — ACETAMINOPHEN 500 MG PO TABS
1000.0000 mg | ORAL_TABLET | Freq: Once | ORAL | Status: AC
  Administered 2023-12-21: 1000 mg via ORAL

## 2023-12-21 MED ORDER — METHOCARBAMOL 500 MG PO TABS: 500.0000 mg | ORAL_TABLET | Freq: Three times a day (TID) | ORAL | Status: DC | PRN

## 2023-12-21 MED ORDER — MAGTRACE LYMPHATIC TRACER
INTRAMUSCULAR | Status: DC | PRN
  Administered 2023-12-21: 2 mL via INTRAMUSCULAR

## 2023-12-21 MED ORDER — 0.9 % SODIUM CHLORIDE (POUR BTL) OPTIME
TOPICAL | Status: DC | PRN
  Administered 2023-12-21: 1000 mL

## 2023-12-21 MED ORDER — LACTATED RINGERS IV SOLN: INTRAVENOUS | Status: DC

## 2023-12-21 MED ORDER — ACETAMINOPHEN 500 MG PO TABS: 1000.0000 mg | ORAL_TABLET | ORAL | Status: AC

## 2023-12-21 MED ORDER — PROPOFOL 500 MG/50ML IV EMUL
INTRAVENOUS | Status: DC | PRN
  Administered 2023-12-21: 150 ug/kg/min via INTRAVENOUS
  Administered 2023-12-21: 175 ug/kg/min via INTRAVENOUS

## 2023-12-21 MED ORDER — LIDOCAINE 2% (20 MG/ML) 5 ML SYRINGE
INTRAMUSCULAR | Status: AC
  Filled 2023-12-21: qty 5

## 2023-12-21 MED ORDER — MIDAZOLAM HCL 2 MG/2ML IJ SOLN
INTRAMUSCULAR | Status: AC
  Filled 2023-12-21: qty 2

## 2023-12-21 MED ORDER — OXYCODONE HCL 5 MG PO TABS
5.0000 mg | ORAL_TABLET | ORAL | Status: DC | PRN
  Administered 2023-12-21: 5 mg via ORAL
  Filled 2023-12-21: qty 1

## 2023-12-21 MED ORDER — ACETAMINOPHEN 500 MG PO TABS
ORAL_TABLET | ORAL | Status: AC
  Filled 2023-12-21: qty 2

## 2023-12-21 MED ORDER — METHOCARBAMOL 1000 MG/10ML IJ SOLN: 500.0000 mg | Freq: Three times a day (TID) | INTRAMUSCULAR | Status: DC | PRN

## 2023-12-21 MED ORDER — OXYCODONE HCL 5 MG/5ML PO SOLN: 5.0000 mg | Freq: Once | ORAL | Status: DC | PRN

## 2023-12-21 MED ORDER — CEFAZOLIN SODIUM-DEXTROSE 2-4 GM/100ML-% IV SOLN
2.0000 g | INTRAVENOUS | Status: AC
  Administered 2023-12-21: 2 g via INTRAVENOUS

## 2023-12-21 MED ORDER — ONDANSETRON HCL 4 MG/2ML IJ SOLN
INTRAMUSCULAR | Status: AC
  Filled 2023-12-21: qty 2

## 2023-12-21 MED ORDER — TRANEXAMIC ACID 1000 MG/10ML IV SOLN
Status: DC | PRN
  Administered 2023-12-21: 3000 mg via TOPICAL

## 2023-12-21 MED ORDER — FENTANYL CITRATE (PF) 100 MCG/2ML IJ SOLN
INTRAMUSCULAR | Status: DC | PRN
  Administered 2023-12-21: 50 ug via INTRAVENOUS
  Administered 2023-12-21 (×2): 25 ug via INTRAVENOUS

## 2023-12-21 MED ORDER — ONDANSETRON 4 MG PO TBDP: 4.0000 mg | ORAL_TABLET | Freq: Four times a day (QID) | ORAL | Status: DC | PRN

## 2023-12-21 SURGICAL SUPPLY — 61 items
BENZOIN TINCTURE PRP APPL 2/3 (GAUZE/BANDAGES/DRESSINGS) IMPLANT
BINDER BREAST LRG (GAUZE/BANDAGES/DRESSINGS) IMPLANT
BINDER BREAST MEDIUM (GAUZE/BANDAGES/DRESSINGS) IMPLANT
BINDER BREAST XLRG (GAUZE/BANDAGES/DRESSINGS) IMPLANT
BINDER BREAST XXLRG (GAUZE/BANDAGES/DRESSINGS) IMPLANT
BIOPATCH RED 1 DISK 7.0 (GAUZE/BANDAGES/DRESSINGS) IMPLANT
BLADE CLIPPER SURG (BLADE) IMPLANT
BLADE HEX COATED 2.75 (ELECTRODE) IMPLANT
BLADE SURG 10 STRL SS (BLADE) ×1 IMPLANT
BLADE SURG 15 STRL LF DISP TIS (BLADE) ×1 IMPLANT
CANISTER SUCT 1200ML W/VALVE (MISCELLANEOUS) ×1 IMPLANT
CHLORAPREP W/TINT 26 (MISCELLANEOUS) ×1 IMPLANT
CLIP APPLIE 11 MED OPEN (CLIP) IMPLANT
CLIP APPLIE 9.375 MED OPEN (MISCELLANEOUS) IMPLANT
CLIP TI WIDE RED SMALL 6 (CLIP) IMPLANT
COVER BACK TABLE 60X90IN (DRAPES) ×1 IMPLANT
COVER MAYO STAND STRL (DRAPES) ×1 IMPLANT
COVER PROBE CYLINDRICAL 5X96 (MISCELLANEOUS) ×1 IMPLANT
DERMABOND ADVANCED .7 DNX12 (GAUZE/BANDAGES/DRESSINGS) ×1 IMPLANT
DRAIN CHANNEL 19F RND (DRAIN) ×1 IMPLANT
DRAPE TOP ARMCOVERS (MISCELLANEOUS) ×1 IMPLANT
DRAPE U-SHAPE 76X120 STRL (DRAPES) ×1 IMPLANT
DRAPE UTILITY XL STRL (DRAPES) ×1 IMPLANT
DRSG TEGADERM 4X4.75 (GAUZE/BANDAGES/DRESSINGS) IMPLANT
ELECTRODE BLDE 4.0 EZ CLN MEGD (MISCELLANEOUS) IMPLANT
ELECTRODE REM PT RTRN 9FT ADLT (ELECTROSURGICAL) ×1 IMPLANT
EVACUATOR SILICONE 100CC (DRAIN) ×1 IMPLANT
GAUZE PAD ABD 8X10 STRL (GAUZE/BANDAGES/DRESSINGS) ×1 IMPLANT
GAUZE SPONGE 4X4 12PLY STRL LF (GAUZE/BANDAGES/DRESSINGS) IMPLANT
GLOVE BIO SURGEON STRL SZ7 (GLOVE) ×1 IMPLANT
GLOVE BIOGEL PI IND STRL 7.5 (GLOVE) ×1 IMPLANT
GOWN STRL REUS W/ TWL LRG LVL3 (GOWN DISPOSABLE) ×3 IMPLANT
HEMOSTAT ARISTA ABSORB 3G PWDR (HEMOSTASIS) IMPLANT
LIGHT WAVEGUIDE WIDE FLAT (MISCELLANEOUS) ×1 IMPLANT
NDL HYPO 25X1 1.5 SAFETY (NEEDLE) IMPLANT
NDL SAFETY ECLIPSE 18X1.5 (NEEDLE) IMPLANT
PACK BASIN DAY SURGERY FS (CUSTOM PROCEDURE TRAY) ×1 IMPLANT
PENCIL SMOKE EVACUATOR (MISCELLANEOUS) ×1 IMPLANT
PIN SAFETY STERILE (MISCELLANEOUS) ×1 IMPLANT
POWDER SURGICEL 3.0 GRAM (HEMOSTASIS) IMPLANT
RETRACTOR ONETRAX LX 90X20 (MISCELLANEOUS) IMPLANT
SHEET MEDIUM DRAPE 40X70 STRL (DRAPES) IMPLANT
SLEEVE SCD COMPRESS KNEE MED (STOCKING) ×1 IMPLANT
SOLN 0.9% NACL POUR BTL 1000ML (IV SOLUTION) ×1 IMPLANT
SPIKE FLUID TRANSFER (MISCELLANEOUS) IMPLANT
SPONGE T-LAP 18X18 ~~LOC~~+RFID (SPONGE) ×1 IMPLANT
SPONGE T-LAP 4X18 ~~LOC~~+RFID (SPONGE) IMPLANT
STAPLER SKIN PROX WIDE 3.9 (STAPLE) IMPLANT
STRIP CLOSURE SKIN 1/2X4 (GAUZE/BANDAGES/DRESSINGS) IMPLANT
SUT ETHILON 2 0 FS 18 (SUTURE) ×1 IMPLANT
SUT MNCRL AB 4-0 PS2 18 (SUTURE) IMPLANT
SUT SILK 2 0 SH (SUTURE) IMPLANT
SUT VIC AB 2-0 SH 27XBRD (SUTURE) ×2 IMPLANT
SUT VIC AB 3-0 54X BRD REEL (SUTURE) IMPLANT
SUT VIC AB 3-0 SH 27X BRD (SUTURE) IMPLANT
SUT VICRYL 3-0 CR8 SH (SUTURE) ×1 IMPLANT
SYR CONTROL 10ML LL (SYRINGE) IMPLANT
TOWEL GREEN STERILE FF (TOWEL DISPOSABLE) ×2 IMPLANT
TRACER MAGTRACE VIAL (MISCELLANEOUS) IMPLANT
TUBE CONNECTING 20X1/4 (TUBING) ×1 IMPLANT
YANKAUER SUCT BULB TIP NO VENT (SUCTIONS) ×1 IMPLANT

## 2023-12-21 NOTE — Op Note (Signed)
 Preoperative diagnosis: Left breast cancer, clinical stage I Postoperative diagnosis: Same as above Procedure: 1.  Left mastectomy 2.  Injection of mag trace for sentinel lymph node identification 3.  Left deep axillary sentinel lymph node biopsy Surgeon Dr. Adina Bury Anesthesia: General With a pectoral block Specimens: 1.  Left breast tissue marked short superior, long lateral 2.  Left deep axillary sentinel nodes with highest count of 496 Complications: None Drains: 19 French Blake drain left Sponge needle count was correct completion Disposition recovery stable  Indications: This is 72 year old female with a history of right breast cancer treated with lumpectomy radiation and tamoxifen in 2006.  She has been well and then had a recent mammogram that showed 8 cm of upper outer quadrant left breast calcifications.  The clip from biopsy and anterior and posterior extent are 6 cm apart the anterior was thought to be a lobular cancer that was grade 2 with insufficient tissue for prognostic panel.  The posterior biopsy is a CSL with ADH.  We did do an MRI that shows normal lymph nodes.  It shows a normal right breast but there is a 7.7 x 3.3 x 2.6 linear diffuse non-mass like enhancement in the upper and central aspect of the left breast.  Although this is concerning for malignancy and enhancement extended to the base of the nipple.  We discussed all the options and I think it is most reasonable at this point to proceed with a mastectomy and she was agreeable.  Procedure: After informed consent was obtained she was taken to the operating room.  She was given antibiotics.  SCDs were placed.  She underwent a pectoral block.  She was then placed under general anesthesia without complication.  She was prepped and draped in a standard sterile surgical fashion.  A surgical timeout was then performed.  I made a large elliptical incision to encompass the nipple areolar complex.  The inferior limb was  the inframammary fold.  I took this up into the axilla.  I then created flaps of the clavicle, sternum and inframammary fold.  I undermined the inframammary fold for about 5 to 6 cm.  I then laterally removed all the fat from the skin back towards the latissimus.  The breast was then removed from the pectoralis including the pectoralis fashion.  This was marked as above.  There was 1 sentinel node that identified in the breast tissue.  There was an additional deep sentinel node that I removed with guidance of the probe.  There were no brown or palpable nodes present.  There was no activity in the axilla.  I then obtained hemostasis.  I placed a TXA soaked sponge.  I then placed a 28 French Blake drain and secured this with a 2-0 nylon suture.  I then closed the dermis down with 3-0 Vicryl sutures.  The skin was closed with 4-0 Monocryl and glue and Steri-Strips were applied.  She tolerated this well was extubated and transferred recovery stable.

## 2023-12-21 NOTE — Anesthesia Preprocedure Evaluation (Addendum)
 Anesthesia Evaluation  Patient identified by MRN, date of birth, ID band Patient awake    Reviewed: Allergy & Precautions, NPO status , Patient's Chart, lab work & pertinent test results  Airway Mallampati: II  TM Distance: >3 FB Neck ROM: Full    Dental  (+) Teeth Intact, Dental Advisory Given   Pulmonary former smoker   breath sounds clear to auscultation       Cardiovascular negative cardio ROS  Rhythm:Regular Rate:Normal     Neuro/Psych negative neurological ROS  negative psych ROS   GI/Hepatic negative GI ROS, Neg liver ROS,,,  Endo/Other  negative endocrine ROS    Renal/GU negative Renal ROS     Musculoskeletal  (+) Arthritis ,    Abdominal   Peds  Hematology negative hematology ROS (+)   Anesthesia Other Findings   Reproductive/Obstetrics                              Anesthesia Physical Anesthesia Plan  ASA: 2  Anesthesia Plan: General   Post-op Pain Management: Regional block*   Induction: Intravenous  PONV Risk Score and Plan: 4 or greater and Ondansetron , Dexamethasone , Midazolam  and TIVA  Airway Management Planned: LMA  Additional Equipment: None  Intra-op Plan:   Post-operative Plan: Extubation in OR  Informed Consent: I have reviewed the patients History and Physical, chart, labs and discussed the procedure including the risks, benefits and alternatives for the proposed anesthesia with the patient or authorized representative who has indicated his/her understanding and acceptance.     Dental advisory given  Plan Discussed with: CRNA  Anesthesia Plan Comments:          Anesthesia Quick Evaluation

## 2023-12-21 NOTE — Anesthesia Procedure Notes (Signed)
 Procedure Name: LMA Insertion Date/Time: 12/21/2023 10:49 AM  Performed by: Debarah Chiquita LABOR, CRNAPre-anesthesia Checklist: Patient identified, Emergency Drugs available, Suction available and Patient being monitored Patient Re-evaluated:Patient Re-evaluated prior to induction Oxygen Delivery Method: Circle system utilized Preoxygenation: Pre-oxygenation with 100% oxygen Induction Type: IV induction Ventilation: Mask ventilation without difficulty LMA: LMA inserted LMA Size: 4.0 Number of attempts: 1 Airway Equipment and Method: Bite block Placement Confirmation: positive ETCO2 Tube secured with: Tape Dental Injury: Teeth and Oropharynx as per pre-operative assessment

## 2023-12-21 NOTE — Discharge Instructions (Signed)
 [facility] [phone]  MASTECTOMY: POST OP INSTRUCTIONS Take 400 mg of ibuprofen  every 8 hours or 650 mg tylenol  every 6 hours for next 72 hours then as needed. Use ice several times daily also.   A prescription for pain medication may be given to you upon discharge.  Take your pain medication as prescribed, if needed.  If narcotic pain medicine is not needed, then you may take acetaminophen  (Tylenol ), naprosyn (Alleve) or ibuprofen  (Advil ) as needed. Take your usually prescribed medications unless otherwise directed. If you need a refill on your pain medication, please contact your pharmacy.  They will contact our office to request authorization.  Prescriptions will not be filled after 5pm or on week-ends. You should follow a light diet the first 24 hours after surgery.  Resume your normal diet the day after surgery. Most patients will experience some swelling and bruising on the chest and underarm.  Ice packs will help.  Swelling and bruising can take several days to resolve. Wear the binder or surgical bra for 72 hours day and night. After that please wear during the day when up and around only. You do not have to wear at night unless you want to.  It is common to experience some constipation if taking pain medication after surgery.  Increasing fluid intake and taking a stool softener (such as Colace) will usually help or prevent this problem from occurring.  A mild laxative (Milk of Magnesia or Miralax) should be taken according to package instructions if there are no bowel movements after 48 hours. There is glue and steristrips on your incision. They will come off in the next few weeks.  You may take a shower 48 hours after surgery.  Any sutures will be removed at an office visit DRAINS:  If you have drains in place, it is important to keep a list of the amount of drainage produced each day in your drains.  Before leaving the hospital, you should be instructed on drain care.  Call  our office if you have any questions about your drains. I will remove your drains when they put out less than 30 cc or ml for 2 consecutive days. We will communicate with you about drain output and have you come in when ready to be removed.  ACTIVITIES:  You may resume regular (light) daily activities beginning the next day--such as daily self-care, walking, climbing stairs--gradually increasing activities as tolerated.  You may have sexual intercourse when it is comfortable.  Refrain from any heavy lifting or straining until approved by your doctor. You may drive when you are no longer taking prescription pain medication, you can comfortably wear a seatbelt, and you can safely maneuver your car and apply brakes. RETURN TO WORK:  __________________________________________________________ Norma Mayer should see your doctor in the office for a follow-up appointment approximately 2-3 after your surgery.  Your doctor's nurse will typically make your follow-up appointment when she calls you with your pathology report.  Expect your pathology report 3-4 business days after surgery. WHEN TO CALL YOUR DR Maiya Kates: Fever over 101.0 Nausea and/or vomiting Extreme swelling or bruising Continued bleeding from incision. Increased pain, redness, or drainage from the incision. The clinic staff is available to answer your questions during regular business hours.  Please don't hesitate to call and ask to speak to one of the nurses for clinical concerns.  If you have a medical emergency, go to the nearest emergency room or call 911.  A surgeon from [facility] is  always on call at the hospital.  [address], [place], [place]  [address] ? [address] [address], [place], [place]   [address] [phone] ? [phone] ? FAX [phone] Web site: [email or web address]   No tylenol  until after 10am

## 2023-12-21 NOTE — Anesthesia Procedure Notes (Signed)
 Anesthesia Regional Block: Pectoralis block   Pre-Anesthetic Checklist: , timeout performed,  Correct Patient, Correct Site, Correct Laterality,  Correct Procedure, Correct Position, site marked,  Risks and benefits discussed,  Surgical consent,  Pre-op evaluation,  At surgeon's request and post-op pain management  Laterality: Left  Prep: chloraprep       Needles:  Injection technique: Single-shot  Needle Type: Echogenic Stimulator Needle     Needle Length: 9cm  Needle Gauge: 21     Additional Needles:   Procedures:,,,, ultrasound used (permanent image in chart),,    Narrative:  Start time: 12/21/2023 10:05 AM End time: 12/21/2023 10:10 AM Injection made incrementally with aspirations every 5 mL.  Performed by: Personally  Anesthesiologist: Tilford Franky BIRCH, MD  Additional Notes: Discussed risks and benefits of the nerve block in detail, including but not limited vascular injury, permanent nerve damage and infection.   Patient tolerated the procedure well. Local anesthetic introduced in an incremental fashion under minimal resistance after negative aspirations. No paresthesias were elicited. After completion of the procedure, no acute issues were identified and patient continued to be monitored by RN.

## 2023-12-21 NOTE — Progress Notes (Signed)
Assisted Dr. Hollis with left, pectoralis, ultrasound guided block. Side rails up, monitors on throughout procedure. See vital signs in flow sheet. Tolerated Procedure well. ?

## 2023-12-21 NOTE — Interval H&P Note (Signed)
 History and Physical Interval Note:  12/21/2023 10:21 AM  Norma Mayer  has presented today for surgery, with the diagnosis of LEFT BREAST CANCER.  The various methods of treatment have been discussed with the patient and family. After consideration of risks, benefits and other options for treatment, the patient has consented to  Procedure(s) with comments: MASTECTOMY WITH SENTINEL LYMPH NODE BIOPSY (Left) - LEFT MASTECTOMY LEFT AXILLARY SENTINEL NODE BIOPSY as a surgical intervention.  The patient's history has been reviewed, patient examined, no change in status, stable for surgery.  I have reviewed the patient's chart and labs.  Questions were answered to the patient's satisfaction.     Donnice Bury

## 2023-12-21 NOTE — H&P (Signed)
 56 yof who has history of right breast cancer in 2006 treated with lumpectomy/radiotherapy and tamoxifen for five years. She has been well and then had recent screening mm that shows 8 cm of UOQ left breast calcifications. The clip from biopsying the ant and posterior extent are 6 cm apart. 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 There is insufficent tissue for prog panel or ecadherin. 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 She has no mass or dc. She is here with her brother. She is here to discuss options.   Review of Systems: A complete review of systems was obtained from the patient. I have reviewed this information and discussed as appropriate with the patient. See HPI as well for other ROS.  Review of Systems  All other systems reviewed and are negative.  Medical History: Past Medical History:  Diagnosis Date  Arthritis  History of cancer   Patient Active Problem List  Diagnosis  Cancer (CMS/HHS-HCC)  History of primary malignant neoplasm of breast  Malignant neoplasm of unspecified site of right female breast (CMS/HHS-HCC)   Past Surgical History:  Procedure Laterality Date  .Left Breast Biopsy Left 11/25/2023  HYSTERECTOMY N/A  abdominal hysterectomy 2003  Right breast lumpectomy Right  2006   Allergies  Allergen Reactions  Dorzolamide Other (See Comments) and Nausea And Vomiting  Ketoconazole Other (See Comments) and Nausea And Vomiting  headache  Timolol Other (See Comments), Nausea And Vomiting and Unknown  Tropicamide Other (See Comments) and Nausea And Vomiting  Adhesive Rash   Current Outpatient Medications on File Prior to Visit  Medication Sig Dispense Refill  calcium carbonate-vitamin D3 (OS-CAL 500+D) 500 mg-5 mcg (200 unit) tablet Take 1 tablet by mouth once daily   Family History  Problem Relation Age of Onset  Coronary Artery Disease (Blocked arteries around heart) Mother  Hyperlipidemia (Elevated cholesterol) Father     Social History   Tobacco Use  Smoking Status Never  Smokeless Tobacco Never  Marital status: Divorced  Tobacco Use  Smoking status: Never  Smokeless tobacco: Never  Vaping Use  Vaping status: Never Used  Substance and Sexual Activity  Drug use: Never   Objective:   Physical Exam Vitals reviewed.  Constitutional:  Appearance: Normal appearance.  Chest:  Breasts: Right: No inverted nipple, mass or nipple discharge.  Left: No inverted nipple, mass or nipple discharge.  Comments: Right breast with deformity after prior surgery Lymphadenopathy:  Upper Body:  Right upper body: No supraclavicular or axillary adenopathy.  Left upper body: No supraclavicular or axillary adenopathy.  Neurological:  Mental Status: She is alert.    Assessment and Plan:   Left breast mastectomy, left axillary sentinel node biopsy  We discussed the staging and pathophysiology of breast cancer. We discussed all of the different options for treatment for breast cancer including surgery, chemotherapy, radiation therapy, and antiestrogen therapy.  We discussed a sentinel lymph node biopsy as she does not appear to having lymph node involvement right now. We discussed the performance of that with injection of magtrace. We discussed that there is a chance of having a positive node with a sentinel lymph node biopsy and we will await the permanent pathology to make any other first further decisions in terms of her treatment. We discussed up to a 5% risk lifetime of chronic shoulder pain as well as lymphedema associated with a sentinel lymph node biopsy.  We discussed the options for treatment of the breast cancer which included lumpectomy versus a  mastectomy. We discussed the performance of the lumpectomy with radioactive seed placement. We discussed a 5-10% chance of a positive margin requiring reexcision in the operating room. We also discussed that she will likely need radiation therapy if she undergoes  lumpectomy. We discussed mastectomy and the postoperative care for that as well. Mastectomy can be followed by reconstruction. The decision for lumpectomy vs mastectomy has no impact on decision for chemotherapy. Most mastectomy patients will not need radiation therapy. We discussed that there is no difference in her survival whether she undergoes lumpectomy with radiation therapy or antiestrogen therapy versus a mastectomy. There is also no real difference between her recurrence in the breast.  We had discussed lumpectomy but now with MRI and extension to nipple most reasonable to do mastectomy I think to clear this disease. She does not want to have recon at same time.

## 2023-12-21 NOTE — Transfer of Care (Signed)
 Immediate Anesthesia Transfer of Care Note  Patient: Norma Mayer  Procedure(s) Performed: MASTECTOMY WITH SENTINEL LYMPH NODE BIOPSY (Left: Breast)  Patient Location: PACU  Anesthesia Type:GA combined with regional for post-op pain  Level of Consciousness: drowsy  Airway & Oxygen Therapy: Patient Spontanous Breathing and Patient connected to face mask oxygen  Post-op Assessment: Report given to RN and Post -op Vital signs reviewed and stable  Post vital signs: Reviewed and stable  Last Vitals:  Vitals Value Taken Time  BP 138/69 (90)   Temp    Pulse 69 12/21/23 12:29  Resp 13 12/21/23 12:29  SpO2 100 % 12/21/23 12:29  Vitals shown include unfiled device data.  Last Pain:  Vitals:   12/21/23 0941  PainSc: 0-No pain      Patients Stated Pain Goal: 2 (12/21/23 0941)  Complications: No notable events documented.

## 2023-12-21 NOTE — Anesthesia Postprocedure Evaluation (Signed)
 Anesthesia Post Note  Patient: Norma Mayer  Procedure(s) Performed: MASTECTOMY WITH SENTINEL LYMPH NODE BIOPSY (Left: Breast)     Patient location during evaluation: PACU Anesthesia Type: General Level of consciousness: awake and alert Pain management: pain level controlled Vital Signs Assessment: post-procedure vital signs reviewed and stable Respiratory status: spontaneous breathing, nonlabored ventilation, respiratory function stable and patient connected to nasal cannula oxygen Cardiovascular status: blood pressure returned to baseline and stable Postop Assessment: no apparent nausea or vomiting Anesthetic complications: no   No notable events documented.  Last Vitals:  Vitals:   12/21/23 1330 12/21/23 1347  BP: 129/71 (!) 146/83  Pulse: 63 70  Resp: 14 18  Temp:  36.6 C  SpO2: 99% 96%               [name]

## 2023-12-22 ENCOUNTER — Encounter (HOSPITAL_BASED_OUTPATIENT_CLINIC_OR_DEPARTMENT_OTHER): Payer: Self-pay | Admitting: General Surgery

## 2023-12-22 MED ORDER — TIZANIDINE HCL 2 MG PO TABS: 2.0000 mg | ORAL_TABLET | Freq: Three times a day (TID) | ORAL | 0 refills | Status: AC | PRN

## 2023-12-22 MED ORDER — TRAMADOL HCL 50 MG PO TABS: 50.0000 mg | ORAL_TABLET | Freq: Four times a day (QID) | ORAL | 0 refills | Status: AC | PRN

## 2023-12-22 NOTE — Discharge Summary (Signed)
 Physician Discharge Summary  Patient ID: Norma Mayer MRN: [id number] DOB/AGE: September 16, 1950 73 y.o.  Admit date: 12/21/2023 Discharge date: 12/22/2023  Admission Diagnoses: Left breast cancer  Discharge Diagnoses:  Principal Problem:   S/P left mastectomy   Discharged Condition: good  Hospital Course: 30 yof underwent left mastectomy and sn biopsy. Doing well following am ready for dc  Consults: None  Significant Diagnostic Studies: none  Treatments: surgery: left mastectomy, left ax sn biopsy  Discharge Exam: Blood pressure (!) 112/54, pulse 74, temperature 98.2 F (36.8 C), resp. rate 18, height 5' 3.87 (1.622 m), weight 76 kg, SpO2 98%. No hematoma, flaps viable  Disposition: Discharge disposition: 01-Home or Self Care        Allergies as of 12/22/2023       Reactions   Combigan [brimonidine Tartrate-timolol]    Dorzolamide    Ketoconazole Nausea And Vomiting   headache   Mydriacyl [tropicamide]    Other Itching   ADHESIVE TAPE:redness   Timolol    Belladonna Alkaloids Palpitations   headache        Medication List     TAKE these medications    CALCIUM PO Take by mouth.   clobetasol  ointment 0.05 % Commonly known as: TEMOVATE  Place on the skin twice a day for 2 weeks for a flare and then twice a week at bedtime for maintenance dosing.   tiZANidine  2 MG tablet Commonly known as: ZANAFLEX  Take 1 tablet (2 mg total) by mouth every 8 (eight) hours as needed for muscle spasms.   traMADol  50 MG tablet Commonly known as: ULTRAM  Take 1 tablet (50 mg total) by mouth every 6 (six) hours as needed.   VITAMIN D  PO Take by mouth.        Follow-up Information     Ebbie Cough, MD Follow up in 3 week(s).   Specialty: General Surgery Contact information: [address] [place] [address] [phone]                 Signed: Cough Ebbie 12/22/2023, 8:45 AM

## 2023-12-28 ENCOUNTER — Telehealth: Payer: Self-pay | Admitting: *Deleted

## 2023-12-28 ENCOUNTER — Encounter: Payer: Self-pay | Admitting: *Deleted

## 2023-12-28 LAB — SURGICAL PATHOLOGY

## 2023-12-28 NOTE — Telephone Encounter (Signed)
Ordered oncotype per Dr. Feng. Sent requisition to pathology. 

## 2023-12-29 ENCOUNTER — Other Ambulatory Visit

## 2024-01-04 DIAGNOSIS — Z170 Estrogen receptor positive status [ER+]: Secondary | ICD-10-CM | POA: Diagnosis not present

## 2024-01-04 DIAGNOSIS — C50412 Malignant neoplasm of upper-outer quadrant of left female breast: Secondary | ICD-10-CM | POA: Diagnosis not present

## 2024-01-04 NOTE — Progress Notes (Signed)
" ° °  PROVIDER:  DONNICE CARLIN BURY, MD  MRN: [id number] DOB: 1950/05/23 DATE OF ENCOUNTER: 01/04/2024 Interval History:     This is a 73 year old female underwent a left mastectomy and a sentinel node biopsy.  Her drain is still putting too much to take out.  She has some swelling near her axilla.  This is mostly actually her arm tissue.  Her pathology showed multifocal invasive ductal carcinoma.  The largest being 6 mm.  There were 3 nodes in the breast that were negative.  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  This is 100% ER and PR positive.  This is HER2 negative with a Ki-67 of 2%.  She has 2 sentinel nodes that are negative.  She is due to see oncology in early January    Physical Examination:   Physical Exam   Left mastectomy incision healing well without infection, drain has some serous fluid in it, the swelling is a little bit in the axilla but mostly this is her upper arm tissue   Assessment and Plan:     Will keep the drain for today.  She is going to call Friday if it is ready to come out.  If not she will call Monday once it is less than 30 cc for 2 consecutive days the drain can be removed.  We discussed her pathology.  She does have an appointment to see oncology.  Once her drain comes out I will refer to physical therapy.  She is okay to do her exercises at this point.  MATTHEW CARLIN BURY, MD  "

## 2024-01-05 ENCOUNTER — Encounter: Payer: Self-pay | Admitting: *Deleted

## 2024-01-05 ENCOUNTER — Encounter (HOSPITAL_COMMUNITY): Payer: Self-pay

## 2024-01-05 NOTE — Progress Notes (Signed)
 Called patient with oncotype results. Inbasket with result of 8/5% sent to providers. Patient aware of follow up appt on 1/8. No other needs or concerns at this time.

## 2024-01-06 ENCOUNTER — Encounter: Payer: Self-pay | Admitting: *Deleted

## 2024-01-06 NOTE — Progress Notes (Signed)
 Had spoken to patient yesterday regarding oncotype score. She stated at that time she didn't think she was going to need radiation either. Sent oncotype scores to patient's providers and confirmation received today from Dr. Izell no XRT needed. Dr. Ebbie also requested I let patient know no chemo needed. Left message on patient's personal voice mail confirming above and left navigator's number if any questions.

## 2024-01-19 ENCOUNTER — Encounter: Payer: Self-pay | Admitting: *Deleted

## 2024-01-21 ENCOUNTER — Encounter: Payer: Self-pay | Admitting: Nurse Practitioner

## 2024-01-21 ENCOUNTER — Inpatient Hospital Stay: Attending: Hematology | Admitting: Nurse Practitioner

## 2024-01-21 VITALS — BP 127/68 | HR 74 | Temp 97.7°F | Resp 17 | Wt 168.6 lb

## 2024-01-21 DIAGNOSIS — Z79810 Long term (current) use of selective estrogen receptor modulators (SERMs): Secondary | ICD-10-CM | POA: Diagnosis not present

## 2024-01-21 DIAGNOSIS — Z170 Estrogen receptor positive status [ER+]: Secondary | ICD-10-CM | POA: Diagnosis not present

## 2024-01-21 DIAGNOSIS — Z86018 Personal history of other benign neoplasm: Secondary | ICD-10-CM | POA: Insufficient documentation

## 2024-01-21 DIAGNOSIS — Z90722 Acquired absence of ovaries, bilateral: Secondary | ICD-10-CM | POA: Insufficient documentation

## 2024-01-21 DIAGNOSIS — C50412 Malignant neoplasm of upper-outer quadrant of left female breast: Secondary | ICD-10-CM | POA: Diagnosis present

## 2024-01-21 DIAGNOSIS — Z9012 Acquired absence of left breast and nipple: Secondary | ICD-10-CM | POA: Insufficient documentation

## 2024-01-21 DIAGNOSIS — Z90710 Acquired absence of both cervix and uterus: Secondary | ICD-10-CM | POA: Insufficient documentation

## 2024-01-21 DIAGNOSIS — Z79899 Other long term (current) drug therapy: Secondary | ICD-10-CM | POA: Diagnosis not present

## 2024-01-21 DIAGNOSIS — M810 Age-related osteoporosis without current pathological fracture: Secondary | ICD-10-CM | POA: Diagnosis not present

## 2024-01-21 DIAGNOSIS — Z1721 Progesterone receptor positive status: Secondary | ICD-10-CM | POA: Insufficient documentation

## 2024-01-21 MED ORDER — TAMOXIFEN CITRATE 20 MG PO TABS: 20.0000 mg | ORAL_TABLET | Freq: Every day | ORAL | 0 refills | Status: AC

## 2024-01-21 NOTE — Progress Notes (Signed)
 "     [facility]   [phone] [phone]    Patient Care Team: Janey Santos, MD as PCP - General (Internal Medicine) Tyree Nanetta SAILOR, RN as Oncology Nurse Navigator Ebbie Cough, MD as Consulting Physician (General Surgery) Lanny Callander, MD as Consulting Physician (Hematology) Izell Domino, MD as Attending Physician (Radiation Oncology)   CHIEF COMPLAINT: Follow up breast cancer   Oncology History  Invasive ductal carcinoma of breast, female, right [facility])  2006 Initial Diagnosis   Cancer [facility])   Malignant neoplasm of upper-outer quadrant of left female breast (HCC)  2006 Initial Diagnosis   Cancer (HCC)   11/25/2023 Cancer Staging   Staging form: Breast, AJCC 8th Edition - Clinical stage from 11/25/2023: cT3, cN0, cM0 - Signed by Lanny Callander, MD on 12/01/2023 Stage prefix: Initial diagnosis   11/30/2023 Initial Diagnosis   Malignant neoplasm of upper-outer quadrant of left female breast (HCC)   12/12/2023 Genetic Testing   Negative genetic testing. Report date is 12/12/2023.  The [facility] CancerNext+RNAinsight Panel includes sequencing, rearrangement analysis, and RNA analysis for the following 40 genes: APC, ATM, BAP1, BARD1, BMPR1A, BRCA1, BRCA2, BRIP1, CDH1, CDKN2A, CHEK2, FH, FLCN, MET, MLH1, MSH2, MSH6, MUTYH, NF1, NTHL1, PALB2, PMS2, PTEN, RAD51C, RAD51D, RPS20, SMAD4, STK11, TP53, TSC1, TSC2 and VHL (sequencing and deletion/duplication); AXIN2, HOXB13, MBD4, MSH3, POLD1 and POLE (sequencing only); EPCAM and GREM1 (deletion/duplication only). RNA data is routinely analyzed for use in variant interpretation for all genes.       CURRENT THERAPY: S/p left mastectomy, pending anti-estrogen therapy  INTERVAL HISTORY Ms. Khaimov returns for follow up. Last seen by Dr. Lanny 12/02/23. She underwent L lumpectomy by Dr. Ebbie 12/8. Path showed multifocal IDC, DCIS, negative margins, and no positive lymph nodes. Oncotype score is 8.  She  is recovering well with some intermittent discomfort at the left chest wall.  Steri-Strips are still intact.  She has some tissue in the left axilla which is bothersome and starting to wake up.  Will see Dr. Ebbie tomorrow.  She has baseline osteoporosis in the forearm and osteopenia in her hips as well as pre-existing arthritic pain in her knees and hands.  Energy is still recovering.  Historically she had right breast cancer in 2006 s/p lumpectomy, radiation, and tamoxifen  which she tolerated well.  ROS  All other systems reviewed and negative  Past Medical History:  Diagnosis Date   Arthritis    Breast cancer (HCC) 2006   Right   Cancer (HCC) 2006   BREAST CANCER/infiltrating ductal, ER/PR positive   Cataract    Osteopenia 03/2015   T score -2.4 FRAX 7.5%/0.5%  stable from prior DEXA   Retinal detachment    with lens left eye     Past Surgical History:  Procedure Laterality Date   ABDOMINAL HYSTERECTOMY  2003   TAH,BSO mucinous cystadenoma/leiomyoma/endosalpingosis   BREAST BIOPSY  08/1999   X 2 BREAST BX--ATYPICAL DUCTAL HYPERPLASIA   BREAST BIOPSY Left 11/25/2023   MM LT BREAST BX W LOC DEV 1ST LESION IMAGE BX SPEC STEREO GUIDE 11/25/2023 [facility]   BREAST BIOPSY Left 11/25/2023   MM LT BREAST BX W LOC DEV EA AD LESION IMG BX SPEC STEREO GUIDE 11/25/2023 [facility]   BREAST LUMPECTOMY Right 2006   BREAST CANCER 2006   COLONOSCOPY     EYE SURGERY  2009/2010   RETINA EYE SURGERY   MASTECTOMY W/ SENTINEL NODE BIOPSY Left 12/21/2023   Procedure: MASTECTOMY WITH SENTINEL LYMPH NODE  BIOPSY;  Surgeon: Ebbie Cough, MD;  Location: [facility];  Service: General;  Laterality: Left;  LEFT MASTECTOMY LEFT AXILLARY SENTINEL NODE BIOPSY   OOPHORECTOMY     BSO   RETINAL DETACHMENT SURGERY  2009-2010   x5   TONSILLECTOMY  1958     Outpatient Encounter Medications as of 01/21/2024  Medication Sig   CALCIUM PO Take by mouth.    Cholecalciferol (VITAMIN D  PO) Take by mouth.   clobetasol  ointment (TEMOVATE ) 0.05 % Place on the skin twice a day for 2 weeks for a flare and then twice a week at bedtime for maintenance dosing.   [START ON 02/15/2024] tamoxifen  (NOLVADEX ) 20 MG tablet Take 1 tablet (20 mg total) by mouth daily.   tiZANidine  (ZANAFLEX ) 2 MG tablet Take 1 tablet (2 mg total) by mouth every 8 (eight) hours as needed for muscle spasms.   traMADol  (ULTRAM ) 50 MG tablet Take 1 tablet (50 mg total) by mouth every 6 (six) hours as needed.   No facility-administered encounter medications on file as of 01/21/2024.     Today's Vitals   01/21/24 1053 01/21/24 1054  BP: 127/68   Pulse: 74   Resp: 17   Temp: 97.7 F (36.5 C)   SpO2: 100%   Weight: 168 lb 9.6 oz (76.5 kg)   PainSc:  0-No pain   Body mass index is 29.06 kg/m.   ECOG PERFORMANCE STATUS: 1 - Symptomatic but completely ambulatory  PHYSICAL EXAM GENERAL:alert, no distress and comfortable SKIN: no rash  EYES: sclera clear LUNGS: normal breathing effort HEART:  no lower extremity edema NEURO: alert & oriented x 3 with fluent speech, no focal motor/sensory deficits Breast exam: S/p left mastectomy, incisions healing with Steri-Strips intact, no surrounding erythema or drainage.  Soft tissue prominence at the left axilla   CBC    Latest Ref Rng & Units 12/02/2023   12:13 PM 03/08/2010    3:55 PM 09/18/2009    2:10 PM  CBC  WBC 4.0 - 10.5 K/uL 6.0  4.6  5.6   Hemoglobin 12.0 - 15.0 g/dL 85.1  86.4  86.3   Hematocrit 36.0 - 46.0 % 43.3  39.2  39.3   Platelets 150 - 400 K/uL 228  204  206       CMP     Latest Ref Rng & Units 12/02/2023   12:13 PM 03/08/2010    3:55 PM 09/18/2009    2:10 PM  CMP  Glucose 70 - 99 mg/dL 898  91  898   BUN 8 - 23 mg/dL 27  15  19    Creatinine 0.44 - 1.00 mg/dL 9.14  9.13  9.08   Sodium 135 - 145 mmol/L 137  136  139   Potassium 3.5 - 5.1 mmol/L 4.1  3.8  4.0   Chloride 98 - 111 mmol/L 100  102  104   CO2 22 -  32 mmol/L 26  30  28    Calcium 8.9 - 10.3 mg/dL 9.9  9.0  9.4   Total Protein 6.5 - 8.1 g/dL 7.0  6.5  6.1   Total Bilirubin 0.0 - 1.2 mg/dL 0.7  0.7  0.4   Alkaline Phos 38 - 126 U/L 84  49  55   AST 15 - 41 U/L 22  23  16    ALT 0 - 44 U/L 5  10  <8       ASSESSMENT & PLAN:   Invasive mammary carcinoma of the  left breast, pT1b N0 -mammogram revealed a large area of calcification in the left breast measuring about 8 cm, and a core biopsy on November 25, 2023, confirmed invasive mammary carcinoma, grade 2, morphology favor lobular carcinoma, there was insufficient tissue for the prognostic panel testing.  - preop MRI 12/04/2023 showed 7.7 x 3.3 x 2.6 cm linear and diffuse non masslike enhancement in the upper and central aspect of the left breast. All of the enhancement is concerning for malignancy. The enhancement extends to the base of the nipple. -She underwent left mastectomy by Dr. Ebbie 12/21/2023, surgical path showed multifocal IDC grade 2, DCIS with calcifications, and ADH.  Lymph node negative with clear margins.  Oncotype recurrence score of 8 which predicts 5% risk of recurrence in the next 9 years with antiestrogen therapy -She did not require postmastectomy radiation (node neg, clear margins) or adjuvant chemo given the low Oncotype score -Ms. Hedgepeth is recovering well from surgery, will see Dr. Ebbie tomorrow, Steri-Strips still intact. -Given the ER/PR positive breast cancer, recommend antiestrogen to reduce the risk of future recurrence.  She tolerated tamoxifen  well in the past.  Due to her history of osteoporosis, joint pain, and previous hysterectomy I recommend tamoxifen  again.  Potential side effects reviewed.  Patient agrees.  History of right breast cancer -2006, s/p lumpectomy, adjuvant radiation, and Tamoxifen  by Dr. Layla    PLAN: -Surgical path reviewed -Begin adjuvant tamoxifen  in 4-6 weeks when she has recovered more from surgery -Follow-up in 4  months to begin surveillance -Follow-up with Dr. Ebbie 01/22/2024  Orders Placed This Encounter  Procedures   CBC with Differential (Cancer Center Only)    Standing Status:   Future    Expiration Date:   01/20/2025   CMP (Cancer Center only)    Standing Status:   Future    Expiration Date:   01/20/2025      All questions were answered. The patient knows to call the clinic with any problems, questions or concerns. No barriers to learning were detected. I spent 20 minutes counseling the patient face to face. The total time spent in the appointment was 30 minutes and more than 50% was on counseling, review of test results, and coordination of care.   Madgie Dhaliwal K Won Kreuzer, NP 01/21/2024  "

## 2024-01-27 ENCOUNTER — Telehealth: Payer: Self-pay

## 2024-01-27 ENCOUNTER — Other Ambulatory Visit: Payer: Self-pay

## 2024-01-27 NOTE — Telephone Encounter (Signed)
 Received telephone call from the patient inquiring when she is supposed to start Tamoxifen . Spoke w/ Lacie Burton, NP. Let patientknow that she can start Tamoxifen , 02/14/24 Patient voiced understanding.

## 2024-02-26 ENCOUNTER — Ambulatory Visit: Attending: General Surgery | Admitting: Rehabilitation

## 2024-05-03 ENCOUNTER — Other Ambulatory Visit (HOSPITAL_BASED_OUTPATIENT_CLINIC_OR_DEPARTMENT_OTHER)

## 2024-05-25 ENCOUNTER — Inpatient Hospital Stay: Admitting: Hematology

## 2024-05-25 ENCOUNTER — Inpatient Hospital Stay: Attending: Hematology

## 2024-06-27 ENCOUNTER — Ambulatory Visit: Admitting: Obstetrics and Gynecology
